# Patient Record
Sex: Male | Born: 1960
Health system: Southern US, Community
[De-identification: ages and names within clinical notes are randomized; demographics above are authoritative.]

## PROBLEM LIST (undated history)

## (undated) DIAGNOSIS — I10 Essential (primary) hypertension: Secondary | ICD-10-CM

## (undated) DIAGNOSIS — K760 Fatty (change of) liver, not elsewhere classified: Secondary | ICD-10-CM

## (undated) DIAGNOSIS — K439 Ventral hernia without obstruction or gangrene: Secondary | ICD-10-CM

## (undated) DIAGNOSIS — F419 Anxiety disorder, unspecified: Secondary | ICD-10-CM

## (undated) DIAGNOSIS — C61 Malignant neoplasm of prostate: Secondary | ICD-10-CM

## (undated) DIAGNOSIS — E559 Vitamin D deficiency, unspecified: Secondary | ICD-10-CM

## (undated) DIAGNOSIS — K429 Umbilical hernia without obstruction or gangrene: Secondary | ICD-10-CM

## (undated) DIAGNOSIS — K645 Perianal venous thrombosis: Secondary | ICD-10-CM

## (undated) DIAGNOSIS — I4891 Unspecified atrial fibrillation: Secondary | ICD-10-CM

## (undated) DIAGNOSIS — F32A Depression, unspecified: Secondary | ICD-10-CM

## (undated) DIAGNOSIS — R0602 Shortness of breath: Secondary | ICD-10-CM

## (undated) DIAGNOSIS — E669 Obesity, unspecified: Secondary | ICD-10-CM

## (undated) DIAGNOSIS — G4730 Sleep apnea, unspecified: Secondary | ICD-10-CM

## (undated) HISTORY — DX: Ventral hernia without obstruction or gangrene: K43.9

## (undated) HISTORY — PX: CARDIOVERSION: SHX1299

## (undated) HISTORY — DX: Umbilical hernia without obstruction or gangrene: K42.9

## (undated) HISTORY — DX: Perianal venous thrombosis: K64.5

## (undated) HISTORY — DX: Sleep apnea, unspecified: G47.30

## (undated) HISTORY — PX: PROSTATE BIOPSY: SHX241

## (undated) HISTORY — DX: Anxiety disorder, unspecified: F41.9

## (undated) HISTORY — DX: Depression, unspecified: F32.A

## (undated) HISTORY — DX: Essential (primary) hypertension: I10

## (undated) HISTORY — DX: Unspecified atrial fibrillation: I48.91

## (undated) HISTORY — DX: Vitamin D deficiency, unspecified: E55.9

## (undated) HISTORY — DX: Obesity, unspecified: E66.9

## (undated) HISTORY — DX: Fatty (change of) liver, not elsewhere classified: K76.0

## (undated) HISTORY — DX: Shortness of breath: R06.02

## (undated) NOTE — *Deleted (*Deleted)
PIRADS 4 anterior into pelvic floor    PIRADS 3     MR U/S Fusion Biopsy 03/14/20

---
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
## 1998-05-23 ENCOUNTER — Ambulatory Visit (HOSPITAL_COMMUNITY): Admission: RE | Admit: 1998-05-23 | Discharge: 1998-05-23 | Payer: Self-pay | Admitting: Gastroenterology

## 1998-05-23 ENCOUNTER — Encounter: Payer: Self-pay | Admitting: Gastroenterology

## 2002-01-28 ENCOUNTER — Ambulatory Visit (HOSPITAL_BASED_OUTPATIENT_CLINIC_OR_DEPARTMENT_OTHER): Admission: RE | Admit: 2002-01-28 | Discharge: 2002-01-28 | Payer: Self-pay | Admitting: Internal Medicine

## 2004-02-24 ENCOUNTER — Encounter: Admission: RE | Admit: 2004-02-24 | Discharge: 2004-02-24 | Payer: Self-pay | Admitting: Orthopedic Surgery

## 2004-06-10 ENCOUNTER — Inpatient Hospital Stay (HOSPITAL_COMMUNITY): Admission: AD | Admit: 2004-06-10 | Discharge: 2004-06-12 | Payer: Self-pay | Admitting: Interventional Cardiology

## 2005-05-05 ENCOUNTER — Ambulatory Visit (HOSPITAL_COMMUNITY): Admission: RE | Admit: 2005-05-05 | Discharge: 2005-05-05 | Payer: Self-pay | Admitting: Interventional Cardiology

## 2005-05-31 ENCOUNTER — Ambulatory Visit: Payer: Self-pay | Admitting: Internal Medicine

## 2005-06-17 ENCOUNTER — Inpatient Hospital Stay (HOSPITAL_BASED_OUTPATIENT_CLINIC_OR_DEPARTMENT_OTHER): Admission: RE | Admit: 2005-06-17 | Discharge: 2005-06-17 | Payer: Self-pay | Admitting: Interventional Cardiology

## 2005-11-23 ENCOUNTER — Ambulatory Visit: Payer: Self-pay | Admitting: Internal Medicine

## 2005-12-24 ENCOUNTER — Ambulatory Visit: Payer: Self-pay | Admitting: Internal Medicine

## 2005-12-29 ENCOUNTER — Ambulatory Visit: Payer: Self-pay | Admitting: Cardiology

## 2005-12-29 ENCOUNTER — Ambulatory Visit (HOSPITAL_COMMUNITY): Admission: RE | Admit: 2005-12-29 | Discharge: 2005-12-29 | Payer: Self-pay | Admitting: Internal Medicine

## 2005-12-30 ENCOUNTER — Ambulatory Visit: Payer: Self-pay

## 2006-01-26 ENCOUNTER — Ambulatory Visit: Payer: Self-pay | Admitting: Cardiovascular Disease

## 2006-01-26 ENCOUNTER — Ambulatory Visit (HOSPITAL_COMMUNITY): Admission: RE | Admit: 2006-01-26 | Discharge: 2006-01-26 | Payer: Self-pay | Admitting: Internal Medicine

## 2006-02-09 ENCOUNTER — Ambulatory Visit: Payer: Self-pay | Admitting: Internal Medicine

## 2006-02-25 ENCOUNTER — Ambulatory Visit (HOSPITAL_COMMUNITY): Admission: RE | Admit: 2006-02-25 | Discharge: 2006-02-25 | Payer: Self-pay | Admitting: Cardiovascular Disease

## 2006-03-02 ENCOUNTER — Encounter: Payer: Self-pay | Admitting: Cardiology

## 2006-03-02 ENCOUNTER — Ambulatory Visit: Payer: Self-pay | Admitting: Cardiology

## 2006-03-02 ENCOUNTER — Ambulatory Visit (HOSPITAL_COMMUNITY): Admission: RE | Admit: 2006-03-02 | Discharge: 2006-03-02 | Payer: Self-pay | Admitting: Cardiology

## 2006-04-28 ENCOUNTER — Ambulatory Visit: Payer: Self-pay | Admitting: Internal Medicine

## 2006-05-17 DIAGNOSIS — I4891 Unspecified atrial fibrillation: Secondary | ICD-10-CM

## 2006-05-17 HISTORY — PX: ABLATION SAPHENOUS VEIN W/ RFA: SUR11

## 2006-05-17 HISTORY — DX: Unspecified atrial fibrillation: I48.91

## 2006-05-30 ENCOUNTER — Inpatient Hospital Stay (HOSPITAL_COMMUNITY): Admission: EM | Admit: 2006-05-30 | Discharge: 2006-06-01 | Payer: Self-pay | Admitting: Emergency Medicine

## 2006-06-08 ENCOUNTER — Ambulatory Visit (HOSPITAL_COMMUNITY): Admission: RE | Admit: 2006-06-08 | Discharge: 2006-06-08 | Payer: Self-pay | Admitting: Cardiology

## 2006-06-14 ENCOUNTER — Ambulatory Visit: Payer: Self-pay | Admitting: Internal Medicine

## 2006-08-31 ENCOUNTER — Ambulatory Visit (HOSPITAL_COMMUNITY): Admission: RE | Admit: 2006-08-31 | Discharge: 2006-08-31 | Payer: Self-pay | Admitting: Internal Medicine

## 2008-02-26 ENCOUNTER — Ambulatory Visit: Payer: Self-pay | Admitting: Internal Medicine

## 2008-05-17 HISTORY — PX: APPENDECTOMY: SHX54

## 2008-05-17 HISTORY — PX: HERNIA REPAIR: SHX51

## 2008-06-11 ENCOUNTER — Ambulatory Visit (HOSPITAL_COMMUNITY): Admission: RE | Admit: 2008-06-11 | Discharge: 2008-06-12 | Payer: Self-pay | Admitting: Surgery

## 2008-06-11 ENCOUNTER — Encounter: Admission: RE | Admit: 2008-06-11 | Discharge: 2008-06-11 | Payer: Self-pay | Admitting: Internal Medicine

## 2008-06-11 ENCOUNTER — Encounter (INDEPENDENT_AMBULATORY_CARE_PROVIDER_SITE_OTHER): Payer: Self-pay | Admitting: Surgery

## 2008-10-01 ENCOUNTER — Telehealth: Payer: Self-pay | Admitting: Internal Medicine

## 2008-11-15 ENCOUNTER — Telehealth: Payer: Self-pay | Admitting: Internal Medicine

## 2010-05-17 DIAGNOSIS — K645 Perianal venous thrombosis: Secondary | ICD-10-CM

## 2010-05-17 HISTORY — DX: Perianal venous thrombosis: K64.5

## 2010-06-07 ENCOUNTER — Encounter: Payer: Self-pay | Admitting: Cardiology

## 2010-06-15 ENCOUNTER — Other Ambulatory Visit: Payer: Self-pay | Admitting: Dermatology

## 2010-08-31 LAB — CBC
HCT: 40.5 % (ref 39.0–52.0)
Hemoglobin: 14 g/dL (ref 13.0–17.0)
MCV: 90.6 fL (ref 78.0–100.0)
RBC: 4.47 MIL/uL (ref 4.22–5.81)
WBC: 14.4 10*3/uL — ABNORMAL HIGH (ref 4.0–10.5)

## 2010-08-31 LAB — BASIC METABOLIC PANEL
Chloride: 102 mEq/L (ref 96–112)
GFR calc Af Amer: >60 mL/min (ref >60)
Potassium: 3.7 mEq/L (ref 3.5–5.1)
Sodium: 137 mEq/L (ref 135–145)

## 2010-09-29 NOTE — Assessment & Plan Note (Signed)
[facility]                         ELECTROPHYSIOLOGY OFFICE NOTE   NAME:DUNNLendon, George                         MRN:          [id number]  DATE:02/26/2008                            DOB:          02-Apr-1961    Mr. Mooneyhan is seen at his request regarding his blood pressure.  He is  status post atrial fibrillation ablation done by Dr. Gala Romney at [facility].  He came in because he was concerned about his blood pressure.  On  evaluation, it was 140/80.  His lungs were clear.  Heart sounds were  regular.  His extremities were without edema.  I should note also his  weight was 267 up about 15 pounds in the last year and half.   We had a long discussion regarding weight loss, its impact on blood  pressure and its relation with sleep apnea.  My hope is that he will be  able to make a choice to pursue a weight target of 220 pounds, which  with his 42-inch waist is something that should be achievable.   We will plan to see him again 1 year's time.     Duke Salvia, MD, [facility]  Electronically Signed    SCK/MedQ  DD: 02/26/2008  DT: 02/27/2008  Job #: [id number]   cc:   Theressa Millard, M.D.

## 2010-09-29 NOTE — Op Note (Signed)
NAME:  Tony [name], Tony [name] NO.:  [id number]   MEDICAL RECORD NO.:  [id number]          PATIENT TYPE:  OIB   LOCATION:  1501                         FACILITY:  [facility]   PHYSICIAN:  Ardeth Sportsman, MD     DATE OF BIRTH:  August 05, 1960   DATE OF PROCEDURE:  DATE OF DISCHARGE:                               OPERATIVE REPORT   PRIMARY CARE PHYSICIAN:  Dr. Theressa Millard.   SURGEON:  Ardeth Sportsman, MD.   ASSISTANT:  None   PREOPERATIVE DIAGNOSIS:  Acute appendicitis.   POSTOPERATIVE DIAGNOSIS:  Acute appendicitis (nonperforated).  Small umbilical hernia.   PROCEDURE PERFORMED:  Diagnostic laparoscopy with appendectomy.  Primary umbilical hernia repair.   ANESTHESIA:  1. General anesthesia.  2. Local anesthetic and a field block at all port sites.   SPECIMEN:  Appendix.   DRAINS:  None.   ESTIMATED BLOOD LOSS:  10 mL   COMPLICATIONS:  None apparent.   INDICATIONS:  Tony [name] as a pleasant 50 year old gentleman who has had  about a 12-hour history of worsening abdominal pain to the right lower  quadrant when he saw Dr. Earl [name].  Patient underwent a CAT scan  significant for appendicitis.  Therefore, he was sent to [facility] for surgical evaluation.  History and physical and CT scan  findings were concerning for appendicitis.   Anatomy and physiology of the digestive tract was explained.  Pathophysiology of appendicitis with its natural history risks were  discussed.  Options discussed.  Recommendations made for diagnostic  laparoscopy with appendectomy.  Risks, benefits, alternatives discussed.  Questions answered, he and his wife agreed to proceed.   OPERATIVE FINDINGS:  He had early appendicitis with some omental  adhesions to the appendix, but no major suppuration perforation or  abscess.  He had no evidence of any inguinal hernias.  He had evidence  of some old mild adhesions along his sigmoid colon consistent with prior  diverticular attack.  He  had a small umbilical hernia about 8 mm in  size.   DESCRIPTION OF PROCEDURE:  Informed consent was confirmed.  Patient was  given IV cefoxitin just prior to surgery.  He had sequential compression  devices active during the entire case.  He underwent general anesthesia  without any difficulty.  He was positioned supine with both arms tucked.  He had a Foley catheter sterilely placed.  His abdomen and mons pubis  were clipped, prepped and  draped in sterile fashion.   A final port was placed through the umbilical hernia using a mini Hasson  technique.  Capnoperitoneum to 15 mmHg provided good intra-abdominal  insufflation.  Under direct visualization, final port was placed in the  left flank.  The umbilical port was upgraded to a 12 mm port.  A final  port was placed suprapubically away from the bladder.   Camera inspection noted findings above.  The small bowel and omentum  were swept of the right lower quadrants.  The patient placed left side  down.  There were some greater omental attachments to the appendix and  these were freed off.  The appendix tip was somewhat anterior so I was  able to follow it down.  I ended up mobilizing the cecum in a lateral  medial fashion using ultrasonic dissection.  The mesoappendix was  isolated as well as the fold of Treves along the terminal ileum was  carefully freed off as there were some adhesions to the appendix there  as well.  Eventually, I was able to get a window at the base of the  mesoappendix and transect that using ultrasonic dissection.  This showed  excellent visualization of all of the appendix at its base.  Appendix  was stapled off of its base using a laparoscopic stapler to good result.  It was placed in an EndoCatch bag and removed through the umbilical  wound with some minimal dilation.   Camera inspection revealed excellent hemostasis and the staple line was  healthy.  Copious irrigation of a liter was done with clear  return.  There is no evidence of contamination elsewhere.  Capnoperitoneum was  evacuated and ports removed.  The umbilical defect including the hernia  was reapproximated using 0 Vicryl figure-of-eight stitch to good result.  Skin was closed using 4-0 Monocryl stitch.  Sterile dressings applied.   The patient was extubated and sent to the recovery room in stable  addition.  I explained the operative findings to the patient's family  and postop instructions were discussed in detail with the patient's wife  and parents and family.  Questions answered.  They expressed  understanding and appreciation.      Ardeth Sportsman, MD  Electronically Signed     SCG/MEDQ  D:  06/11/2008  T:  06/12/2008  Job:  [id number]   cc:   Theressa Millard, M.D.  Fax: [phone]

## 2010-09-29 NOTE — H&P (Signed)
NAME:  Tony [name], Tony [name] NO.:  [id number]   MEDICAL RECORD NO.:  [id number]          PATIENT TYPE:  AMB   LOCATION:  DAY                          FACILITY:  [facility]   PHYSICIAN:  Ardeth Sportsman, MD     DATE OF BIRTH:  [birth year]   DATE OF ADMISSION:  06/11/2008  DATE OF DISCHARGE:                              HISTORY & PHYSICAL   PRIMARY CARE PHYSICIAN:  Theressa Millard, M.D.   CARDIOLOGIST:  Lyn Records, M.D.   SURGEON:  Ardeth Sportsman, M.D.   REASON FOR CONSULTATION/ADMISSION:  Acute appendicitis.   HISTORY OF PRESENT ILLNESS:  Tony [name] is  50 year old gentleman with  some chronic health issues, pretty well controlled, including sleep  apnea, atrial fibrillation, status post ablation, off all meds, obesity,  and hypertension.  He noted that he woke up last night with abdominal  pain that was rather diffuse.  It was intensified and became more focal  in the right lower quadrant.  He has felt nauseated but not thrown up.  He denies any sick contacts or travel history.  He has always otherwise  been well.  He has not had anything like this before.  He came and saw  Dr. Earl Gala.  The suspicion was high enough that he got a CAT scan which  was suspicious for appendicitis.  Therefore, the patient was sent for  requested surgical consultation.  He is being evaluated for  consideration of surgery.   Patient has had a history of diverticulitis in the past but no recent  pain such as this.  He has had a little bit of back pain but no pain on  the left side.  No hematochezia or melena.   PAST MEDICAL HISTORY:  1. Obstructive sleep apnea, stable on CPAP.  I believe the settings      are 8/5.  2. Atrial fibrillation.  It failed medical therapy and cardioversion,      but ultimately improved with ablation over at [facility], I think under the auspices of Dr. Elesa Hacker.  3. Iliopsoas hematoma from arterial puncture for the ablation.  Gradually resolved over time with some chronic right hip and groin      pain.  4. Steatohepatitis with transaminitis in the past.  5. Baker's cyst in the past.  6. Conjunctivitis in the past.   PAST SURGICAL HISTORY:  Open right inguinal hernia repair in the 60's.  Denies any recurrence.  He has had a vasectomy, but he has not had any  abdominal surgeries.   ALLERGIES:  He has no true drug allergies except for a bee sting.  He  did have sort of a wild, aggressive  reaction after getting his ablation  but does not recall what medications were given at that time.  There was  some question of problems with opioids, but he does not recall any  specifically.   MEDICATIONS:  None at this time.   SOCIAL HISTORY:  He is married and in a stable relationship.  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  Occasionally drinks  wine.  No tobacco or other drug use.   FAMILY HISTORY:  His father had prostate cancer.  Mother has had breast  cancer.  Had a myocardial infarction.  He has 2 siblings alive and well.   REVIEW OF SYSTEMS:  Noted as per HPI.  CONSTITUTIONAL:  No fever,  chills, or sweats.  He is trying to have some weight loss but no major  change in his weight recently.  He does wears glasses, otherwise stable.  ENT:  Stable.  RESPIRATORY:  CPAP is well controlled and improves  sleeping.  He denies any significant change in breathing or dyspnea on  exertion.  CARDIAC:  As noted per HPI.  No recent dysrhythmias noted.  ABDOMEN:  As noted per HPI.  Breasts, testicular, musculoskeletal,  neurological, heme/lymph, allergic are otherwise negative.  DERMATOLOGIC:  He has some benign skin lesions but no history of skin  cancers.  HEPATIC:  Some transaminitis but renal and endocrine are  otherwise negative as well.   PHYSICAL EXAMINATION:  VITAL SIGNS:  Noted in the chart and within  normal range.  He is a well-developed, well-nourished, slightly overweight male,  obviously  uncomfortable.  Not frankly toxic.  PSYCH:  He has above-average intelligence with good insight.  No  evidence of any dementia, delirium, psychosis, or paranoia.  HEENT:  Pupils are equal, round and reactive to light.  Extraocular  muscles are intact.  Sclerae are anicteric and injected.  He does wears  glasses.  He is normocephalic.  Mucous membranes are moist.  Nasopharynx  and oropharynx are clear.  NECK:  Supple without any masses.  Trachea is midline.  LYMPH:  No head, neck, axillary, or groin lymphadenopathy.  CHEST:  Clear to auscultation bilaterally.  No wheezes, rales or  rhonchi.  No pain to rib or sternal compression.  HEART:  Regular rate and rhythm.  ABDOMEN:  Obese but soft.  He is exquisitely tender in the right lower  quadrant over McBurney's point in the classic location with a positive  Rovsing's sign with pressing on the left side.  He has some mild diffuse  tenderness.  He may have some slight weakness at the belly button and  umbilicus, but no definite umbilical hernia.  GENITAL:  He has some fullness in both groins but no definite inguinal  hernia.  Some tenderness in his right greater than left groin, otherwise  he is circumcised.  Testes are descended bilaterally.  RECTAL:  Deferred.  SKIN:  No obvious petechiae.  No purpura.  No major source of lesions.  MUSCULOSKELETAL:  Full range of motion in the shoulders, wrists, hips,  knees, and ankles.  BACK:  Some mild lumbar back pain but no specific point tenderness.   His labs are pending.   EKG shows sinus rhythm.   CT scan shows the distal half of his appendix is somewhat thickened with  some perifat stranding, consistent with early appendicitis.  No evidence  of perforation or abscess.  No evidence of bowel obstruction.  I cannot  see any definite inguinal or umbilical hernia at this time.   ASSESSMENT:  Acute appendicitis:  The anatomy and physiology of  digestive tract explained.  The pathophysiology of  appendicitis was  explained.  Options were discussed and recommendations made for  diagnostic laparoscopy with removal of appendix.  Risks, benefits and  alternatives were discussed in detail.  Questions answered with he and  his wife, and  they agreed to proceed.   PLAN:  1. Admit for diagnostic laparoscopy and appendectomy.  2. IV antibiotics.  3. Follow up lab-work values.  4. CPAP postop.  He brought his machine with him.      Ardeth Sportsman, MD  Electronically Signed     SCG/MEDQ  D:  06/11/2008  T:  06/11/2008  Job:  [id number]   cc:   Theressa Millard, M.D.  Fax: [phone]   Lyn Records, M.D.  Fax: [phone]

## 2010-10-02 NOTE — Letter (Signed)
November 23, 2005    Dr. Shawnie Pons  [facility]  [address]  [place], [place]  [address]   RE:  DIALLO, PONDER  MRN #[id number]  /  DOB:  14-Jun-1960   Dear Dorene Grebe,   This letter is to update you on Tony [name] who will be coming to see you  later this month.  You should have a packet on him.  He is a 50 year old  gentleman with paroxysmal and now persistent atrial fibrillation who desires  ablation of his atrial fibrillation.  He has previously been on sotalol  which failed to hold him in sinus rhythm.  Because of a lot of business  issues the alternative of using Tikosyn in the short term was deferred and  now his appointment with you is forthcoming.  We had a lengthy discussion  about treatment options and his desire to get this done remains high.  He  has been anticoagulated.  I currently have discontinued his sotalol as he  has been in atrial fibrillation on it for the last number of months and have  given him prescriptions for alternative beta blockers to see if he can find  one that is not associated with the lethargy the sotalol is causing.   I look forward to hearing from you about him.  My best to you and your  family.   Lenon Oms, MD, [facility]   SCK/MedQ  DD:  11/23/2005  DT:  11/23/2005  Job #:  [id number]

## 2010-10-02 NOTE — Op Note (Signed)
NAME:  Tony [name], Tony [name] NO.:  [id number]   MEDICAL RECORD NO.:  [id number]          PATIENT TYPE:  INP   LOCATION:  2006                         FACILITY:  [facility]   PHYSICIAN:  Lyn Records III, M.D.DATE OF BIRTH:  April 12, 1961   DATE OF PROCEDURE:  06/12/2004  DATE OF DISCHARGE:                                 OPERATIVE REPORT   PROCEDURE:  Elective electrical cardioversion.   INDICATIONS FOR PROCEDURE:  Atrial fibrillation.   DESCRIPTION OF PROCEDURE:  The patient was given sedation with sodium  Pentothal 375 mg IV by Dr. Sampson Goon.  After the patient was asleep, using  an anterior and posterior electrode orientation, 200 joules of biphasic  energy was discharged with reversion to normal sinus rhythm/sinus  bradycardia.  The patient awakened with no complications.   CONCLUSION:  Successful conversion from atrial fibrillation to normal sinus  rhythm.   PLAN:  1.  Continue Betapace 120 mg p.o. b.i.d.  2.  Home later today if he awakens adequately and has no evidence of QT      prolongation and no other problems arise.      HWS/MEDQ  D:  06/12/2004  T:  06/12/2004  Job:  [id number]   cc:   Theressa Millard, M.D.  [address] [place]  [place] [address]  Fax: [phone]

## 2010-10-02 NOTE — Discharge Summary (Signed)
NAME:  Tony, [name] NO.:  [id number]   MEDICAL RECORD NO.:  [id number]          PATIENT TYPE:  OIB   LOCATION:  2899                         FACILITY:  [facility]   PHYSICIAN:  Maple Mirza, PA   DATE OF BIRTH:  03/09/1961   DATE OF ADMISSION:  02/25/2006  DATE OF DISCHARGE:  02/25/2006                                 DISCHARGE SUMMARY   ALLERGIES:  THIS PATIENT HAS NO KNOWN DRUG ALLERGIES.   PRINCIPLE DIAGNOSES:  1. Recurrence of atrial fibrillation with controlled ventricular rate.      a.     Multiple cardioversions in the past, the last one September 2007       on flecainide 100 mg b.i.d., currently on flecainide 150 mg       b.i.d.      b.     First diagnosis atrial fib 03/2004 at physical exam.      c.     The patient in the past has failed Sotalol.  Increased doses       cause fatigue and bradycardia.  Decreased doses cause       recurrence.      d.     Started Flecainide July 2007.  Tikosyn considered, but deferred       secondary to patient anticipating atrial fibrillation       ablation       at [facility] March 14, 2006.   1. DCCV cancelled February 25, 2006, secondary to sub-therapeutic INR which      was 1.7.  A stat redo was 1.8.  Oddly enough, records from [facility]      [facility]iology showed that his INR was 3.0, as recently as February 22, 2006.  2. Catheterization February 2007.  Normal coronaries, ejection fraction      greater than or equal to 50%.  3. Obstructive sleep apnea.  4. Modest obesity.   PROCEDURE:  The planned procedure, elective DC Cardioversion cancelled  secondary to sub-therapeutic INR.   BRIEF HISTORY:  Mr. Tony [name] is a 50 year old male with history of atrial  fibrillation, first diagnosed at physical examination November 2005.  He had  an echocardiogram at that time which showed ejection fraction 40%.  He was  started on sotalol.  His first cardioversion was January 2006, and his  Sotalol had been decreased 120 mg to 80 mg  secondary to fatigue.  The second  Cardioversion actually came about 12 months later December 2006.  He had an  echocardiogram in May 2006 which showed ejection fracture of 60%.  The  patient is not very clear as to what medications he was on after his Sotalol  was discontinued.  We do know that flecainide was started, after successful  catheterization showing normal coronaries in February 2007, at 100 mg b.i.d.  He had recurrence in August 2007 and had his third Cardioversion.  At that  time, his flecainide was increased to 150 mg b.i.d.  He recurred in  September 2007, and had his fourth Cardioversion at that time.   The patient noted that he gone back  into atrial fibrillation February 21, 2006.  I know when I am back.  He called the office of [facility]  on October 9th, was told to present October 12th at 2:00 for Cardioversion.  At that time, it was known that his INR was therapeutic throughout the year  of 2007.   HOSPITAL COURSE:  As noted above, the patient's Cardioversion was  discontinued.  He discharges with a follow-up CEE/DCCV on Wednesday October  17 at 2:00, Dr. Nona Dell.  He is to eat nothing after midnight  Tuesday October 16th.   MEDICATIONS ON DISCHARGE:  1. Coumadin 12.5 mg to take Friday, Saturday and Sunday and then to resume      his regular doses of 10 mg Monday, Wednesday, Friday, 12.5 mg Tuesday,      Thursday, Saturday, Sunday.  We will redo PT/INR, when he presents on      Wednesdays October 17th.           ______________________________  Maple Mirza, PA     GM/MEDQ  D:  02/25/2006  T:  02/27/2006  Job:  [id number]   cc:   Duke Salvia, MD, [facility]  Lyn Records, M.D.  Theressa Millard, M.D.

## 2010-10-02 NOTE — Letter (Signed)
June 14, 2006    Dr. Val Riles  [facility]  [facility] [id number]  [place], [place] [address]  [facility] #[id number]   RE:  Tony [name], Tony [name]  MRN:  [id number]  /  DOB:  [birth year]   Dear Rosaura Carpenter,   Mr. Eifler came in today following his PVI ablation. He has had no  recurrent arrhythmia. He is on his Flecainide at 150 twice daily.   As you know, he had an iliopsoas bleed that was managed with vitamin K.  I should note that he also had a moderate pericardial effusion by CT.  His INR at discharge was 1.2, Coumadin was resumed and he is on  anticoagulation at the present.   He had as related to his iliopsoas bleed significant left lower leg  weakness which has now resolved in large part.   PHYSICAL EXAMINATION:  His blood pressure was 110/70, his pulse was 55,  his leg strenth was pretty good.   IMPRESSION:  1. Paroxysmal atrial fibrillation status PVI.  2. Iliopsoas bleed and pericardial effusion probably related to the      above.  3. Leg weakness related to the above now much improved.  4. Flecainide therapy for #1.   I trust Mr. Heckmann is doing pretty well. He continues on his Flecainide. I  told him that when he saw you in a couple of weeks that you would make a  decision as to how long he should stay on Flecainide. My bias would be  to keep it on longer rather than shorter.    Sincerely,      Duke Salvia, MD, [facility]  Electronically Signed    SCK/MedQ  DD: 06/14/2006  DT: 06/14/2006  Job #: [id number]   CC:    Lyn Records, M.D.

## 2010-10-02 NOTE — Op Note (Signed)
NAME:  Tony [name], Tony [name] NO.:  [id number]   MEDICAL RECORD NO.:  [id number]          PATIENT TYPE:  OIB   LOCATION:  2899                         FACILITY:  [facility]   PHYSICIAN:  Veverly Fells. Excell Seltzer, MD  DATE OF BIRTH:  01/12/1961   DATE OF PROCEDURE:  DATE OF DISCHARGE:  02/25/2006                                 OPERATIVE REPORT   PRIMARY CARE DOCTOR:  Dr. Theressa Millard.   ELECTROPHYSIOLOGIST:  Dr. Sherryl Manges.   CARDIOLOGIST:  Dr. Lyn Records.   HE HAS NO KNOWN DRUG ALLERGIES.   PRESENTING CIRCUMSTANCE:  I am here for a cardioversion.   HISTORY OF PRESENT ILLNESS:  Tony [name] is a 50 year old male.  He has a  history of atrial fibrillation, first diagnosed on a routine physical  examination in November 2005.  He had an echocardiogram at that time, which  showed an ejection fraction of 40%.  He was started on sotalol with initial  success at maintaining sinus rhythm.  His first cardioversion for recurrence  was in January 2006.  His sotalol had been recently discontinued from 120 mg  b.i.d. to 80, secondary to fatigue and bradycardia.  His second  cardioversion was actually about 12 months later in December 2006, and his  sotalol was eventually discontinued secondary to continued fatigue and  bradycardia.  In May 2006, he had a followup echocardiogram which showed an  ejection fraction of 60%.  The patient is not clear as to what medications  he was actually maintained on after discontinuing sotalol, however, we do  pick up the history in July 2007, after a successful catheterization  February 2007, which showed normal coronaries, an ejection fraction greater  than 50%.  He underwent the initiation of flecainide 100 mg twice a day.  He  underwent his third cardioversion in August 2007, under flecainide.  He did  well until about a month later, when he recurred back into atrial  fibrillation.  His flecainide was increased to 150 mg b.i.d. and he had his  fourth cardioversion in September.   On February 21, 2006, the patient noted that he had relapsed back into atrial  fibrillation.  I know when I am back.  He called the office of [facility] on February 22, 2006.  He was told to present on February 25, 2006,  at 2 p.m. for cardioversion.  Of note, the patient's atrial fibrillation had  been actually very rapid especially with exercise in the past but lately it  is a controlled ventricular rate.  We have his INR records from [facility], which showed that the INR is above 2 throughout the year of  2007.  In fact on February 22, 2006, his INR was 3.  However, on presentation  today for cardioversion his INR was 1.7.  A STAT redo showed an INR of 1.8.  His cardioversion appointment was cancelled for today, February 25, 2006.   The patient's medications include:  1. Coumadin 10 mg Monday, Wednesday, Friday; 12.5 mg Saturday, Sunday,      Tuesday,  Thursday.  2. Atenolol 50 mg daily.  3. Flecainide 150 mg twice daily.   SOCIAL HISTORY:  The patient lives in [place] with his wife of 24 years.  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  He does not smoke, occasional alcoholic beverages, no  recreational drugs.   FAMILY HISTORY:  His mother is living.  She has been diagnosed with a long  QT syndrome.  Father is living.  He has no cardiac history.  He has two  sisters with atrial fibrillation.   REVIEW OF SYSTEMS:  He denied having fever, chills, night sweats, or weight  change in last 6 months.  No recurrent headaches, no nasal discharge or  epistaxis.  No voice change, no vertigo, no photophobia.  INTEGUMENT:  No  rashes or non-healing ulcerations to the lower extremities.  CARDIOPULMONARY:  No chest pain.  He does get short of breath with dyspnea  on exertion with return of atrial fibrillation.  He has sometimes had  paroxysmal nocturnal dyspnea but this is more likely from his  obstructive  sleep apnea.  He has a history of presyncope at times and would swoon but  never frank syncope and since his atrial fibrillation is slow, he does not  have a feeling of palpitation.  UROGENITAL:  No urinary problems, no  frequency or urgency, dysuria, or nocturia.  NEUROPSYCHIATRIC:  No weakness,  numbness, depression, or anxiety.  GASTROINTESTINAL:  No history of frequent  heartburn or reflux.  No history of GI bleeding.  ENDOCRINE:  No history of  thyroid or diabetes.  No polyuria, no polydipsia.  No skin or hair changes.  MUSCULOSKELETAL:  No arthralgias, no joint effusions.  All other systems  negative.   PAST MEDICAL HISTORY:  1. Borderline hypertension.  2. Obstructive sleep apnea.  3. Modest obesity.   He denies prior history of thyroid problems, diabetes, cerebrovascular  accident, myocardial infarction, DVT, or pulmonary embolus.   He had a catheterization in February 2007, which was done for a Myoview  suggesting inferoapical ischemia.  He was found to have normal coronaries,  ejection fraction greater than or equal to 50%.  This was a false positive  Myoview.  He had an echocardiogram in May 2006, which showed ejection  fraction 60%   PHYSICAL EXAMINATION:  VITAL SIGNS:  Temperature 97.1, pulse is 75  irregular, blood pressure 133/85, respirations of 20, oxygen saturation 98%  on room air.  Weight is 245.  GENERAL:  He is alert and oriented x3.  HEENT:  Normocephalic, atraumatic.  Eyes pupils equal, round, reactive to  light.  Extraocular movements are intact.  Sclerae are anicteric.  Nares  without discharge.  NECK:  Supple without carotid bruits.  No thyromegaly.  No jugular venous  distension.  No cervical lymphadenopathy.  HEART:  Has slow and irregular rhythm without murmurs, rubs, or gallops.  LUNGS:  Clear to auscultation/percussion bilaterally.  ABDOMEN:  Soft, nondistended.  No rebound or guarding.  No hepatosplenomegaly.  Abdominal aorta is  non-pulsatile, non-expansile.  EXTREMITIES:  Show no evidence of clubbing, cyanosis, edema.  MUSCULOSKELETAL:  No joint deformity, effusions, kyphosis, or scoliosis.  NEUROLOGIC:  Neurologic: Alert, oriented x3.  Cranial nerves grossly intact.   LABORATORY STUDIES:  This admission, the PT was 20.7, INR 1.7.  Followup INR  was 1.8.  Complete blood count:  White cells 5.2, hemoglobin 14.9,  hematocrit 43.1, platelets 187.  Serum electrolytes:  Sodium 138,  potassium  3.8, chloride 103, carbonate 27, BUN 16, creatinine 1.2, glucose was 87.   IMPRESSION:  1. Recurrence of atrial fibrillation with controlled ventricular rates.      a.     Multiple D/C cardioversion procedures in the past.  The last one       was in September 2007, on flecainide 100 mg twice a day, which was       subsequently increased to 150 mg twice a day.      b.     First diagnosed with atrial fibrillation ion November 2005, on       physical exam.      c.     Failed sotalol, increasing doses caused fatigue and bradycardia,       decreasing doses cause recurrence of atrial fibrillation.      d.     Started flecainide July 2007.  Tikosyn was considered but       deferred as the patient is a candidate for atrial fibrillation       ablation, October 29 at the [facility].  2. Catheterization, February 2007, normal coronaries, ejection fraction      greater than or equal to 50%.  3. Sub-therapeutic INR on presentation February 25, 2006.  INR was 3.0 and      blood draw at [facility] on October 9.  4. Obstructive sleep apnea.  5. Modest obesity.   PLAN:  1. Cardioversion.  Plan for today is cancelled.  2. The patient will have followup transesophageal echocardiogram D/C      cardioversion on Wednesday, March 02, 2006 at 2 p.m. with Dr. Nona Dell.  We would like to get a PT/INR at that time.  3. He will discharge on his presentation medications, except for Coumadin      12.5 mg to be taken today and  over the weekend Saturday and Sunday and      to recommence his normal schedule which is 12.5 mg Tuesday, Thursday,      Saturday, Sunday and 10 mg Monday, Wednesday, Friday.  4. Once again he presents to [facility] short stay and then to      endoscopy Wednesday, October 17 at 2.      a.     He is asked to eat nothing after midnight Tuesday October 16,       20 07.      Maple Mirza, [place]      Veverly Fells. Excell Seltzer, MD  Electronically Signed    GM/MEDQ  D:  02/25/2006  T:  02/27/2006  Job:  [id number]

## 2010-10-02 NOTE — Cardiovascular Report (Signed)
NAME:  Tony [name], Tony [name] NO.:  [id number]   MEDICAL RECORD NO.:  [id number]          PATIENT TYPE:  OIB   LOCATION:  1963                         FACILITY:  [facility]   PHYSICIAN:  Lyn Records, M.D.   DATE OF BIRTH:  [birth year]   DATE OF PROCEDURE:  06/17/2005  DATE OF DISCHARGE:                              CARDIAC CATHETERIZATION   INDICATIONS:  The patient has been referred to the [facility] electrophysiology  group for help with management of atrial fibrillation and consideration of  catheter ablation and/or alternative treatment options.  The patient has  undergone successful electrical cardioversion on two prior occasions and has  been relatively stable on Betapace; however, moderate doses of Betapace  cause bradycardia and unacceptable side effects.  On lower doses of  Betapace, atrial fibrillation recurs.  His most recent cardioversion was  done in late December.  Upon referral to Dr. Graciela Husbands, consideration of  dofetilide therapy has occurred versus [facility]  Dr. Graciela Husbands requested an ischemic evaluation.  Cardiolite  study suggested the possibility of inferoapical ischemia.  It was felt that  this scan appearance was low risk but considering the need for no ischemia  to be able to safely use dofetilide, coronary angiography is being  performed.   PROCEDURE PERFORMED:  1.  Left heart catheterization.  2.  Selective coronary angiography.  3.  Left ventriculography.   DESCRIPTION:  After informed consent, a 4 French sheath was placed in the  right femoral artery using the modified Seldinger technique.  A 4-French A2  multipurpose catheter was used for hemodynamic recordings, left  ventriculography by hand injection, and selective right coronary  angiography.  Left coronary angiography was performed with a 4-French #4  left Judkins catheter.  The patient tolerated the procedure without  complications.   RESULTS:   HEMODYNAMIC DATA:  1.  Aortic pressure 106/82.  2.  Left ventricular pressure 106/.   LEFT VENTRICULOGRAPHY:  The left ventricle is mildly to moderately dilated.  There appears to be some suggestion of mild global hypokinesis with an  ejection fraction of at least 50%.  No mitral regurgitation is noted.   CORONARY ANGIOGRAPHY:  1.  Left main coronary:  The left main coronary artery is large, relatively      short, and gives origin to a large LAD, a dominant circumflex and ramus      intermedius branch.  No obstruction was noted.  2.  Left anterior descending coronary:  LAD reaches the left ventricular      apex.  It gives origin to a large proximal diagonal.  Minimal luminal      irregularity is noted in the LAD beyond the first septal perforator.  No      significant LAD obstruction noted, and the LAD is generally felt to be      normal.  3.  Circumflex artery:  The circumflex is a dominant vessel that gives rise      to three obtuse marginal branches and a PDA.  The obtuse marginal branch  is relatively small.  The circumflex proper is entirely normal.  The      first obtuse marginal contains minimal luminal irregularities.  A large      third obtuse marginal is free of any obstruction,  and one additional      obtuse marginal and the PDA are widely patent.  4.  Ramus intermedius:  The ramus intermedius arises from the distal left      main.  It is moderate in size and is free of obstruction.  5.  Right coronary:  The right coronary is nondominant and is normal.   CONCLUSION:  1.  Normal coronary arteries without evidence of any significant      atherosclerotic plaque or obstructive disease.  The patient's coronary      circulation is left-dominant.  2.  Left ventricular enlargement with mild global hypokinesis but overall      preserved ejection fraction, greater than or equal to 50%.  3.  False positive Cardiolite.   PLAN:  Further evaluation and therapy of atrial  fibrillation per Dr. Graciela Husbands.      Lyn Records, M.D.  Electronically Signed     HWS/MEDQ  D:  06/17/2005  T:  06/17/2005  Job:  [id number]   cc:   [facility] Salvia, M.D.  [address]  [address]  [place]  [place] [address]   Theressa Millard, M.D.  Fax: [phone]

## 2010-10-02 NOTE — Discharge Summary (Signed)
NAME:  Tony [name], Tony [name] NO.:  [id number]   MEDICAL RECORD NO.:  [id number]          PATIENT TYPE:  INP   LOCATION:  4728                         FACILITY:  [facility]   PHYSICIAN:  Lyn Records, M.D.   DATE OF BIRTH:  12/23/1960   DATE OF ADMISSION:  05/30/2006  DATE OF DISCHARGE:  06/01/2006                               DISCHARGE SUMMARY   PRIMARY CARE PHYSICIAN:  Theressa Millard.   ADMISSION DIAGNOSIS:  Left leg pain.   DISCHARGE DIAGNOSES:  1. Left leg pain secondary to iliopsoas hematoma called by a      spontaneous bleed.  2. History of paroxysmal atrial fibrillation status post catheter      ablation one week ago at [facility], now maintaining sinus      rhythm.  3. Mild to moderate pericardial effusion via CT.  4. Elevated liver enzymes, questionable etiology.  5. Anemia with decreased hemoglobin and hematocrit, stable.  6. Thrombocytopenia, questionable etiology.  7. Systemic anticoagulation with a subtherapeutic INR of 1.2.   PROCEDURES:  None during this admission.   HOSPITAL COURSE:  Tony [name] is a 50 year old Caucasian male with a known  history of paroxysmal atrial fibrillation.  He is status post a catheter  ablation at [facility] one week ago.  He was admitted to the [facility] on May 30, 2006 complaining of left leg pain and  muscle soreness.  A CT of abdomen and pelvis revealed an iliopsoas  hematoma.  Also, a mild to moderate pericardial effusion was identified  via CT.  The patient was on home Lovenox and was then transitioned to  Coumadin.  Lovenox and Coumadin were both held in the hospital.  The  patient's liver enzymes were elevated with a questionable etiology with  an AST upon admission of 138 and 62 upon discharge, an ALT of 297 upon  admission and 196 upon discharge.  He was restarted on Coumadin during  this admission and had an INR of 1.2.  A 2-D echocardiogram is pending  secondary to the pericardial effusion  diagnosis.  The patient's  hemoglobin dropped from 13.3 to 11.2; however, he was stable.  On  telemetry, he remained in normal sinus rhythm on flecainide 150 mg twice  daily and atenolol 25 mg daily.  Upon admission, he was given vitamin K.  On June 01, 2006, he was discharged to home in stable condition with  improvement in the leg pain.  He was discharged to home on Coumadin 10  mg daily with scheduled PT and INR check at the [facility] Coumadin clinic on  Friday of this week.  She is also being discharged to home on Vicodin  5/500 mg as needed for pain without refills.   LABORATORY DATA:  White blood count 5.0, hemoglobin 11.2, hematocrit  32.9, platelets 147,000.  PT 15.5, INR 1.2, PTT 39.  Sodium 140,  potassium 4.2, chloride 108, CO2 27, glucose 97, BUN 12, creatinine  1.06.  AST 138, 98, and 62, respectively; ALT 297, 256, and 196,  respectively.  Total protein upon discharge is  5.9, serum albumin upon  discharge is 3.2.   X-RAYS:  CT of abdomen without contrast on May 30, 2006 revealed a  mild to moderate anterior pericardial effusion.  CT of the pelvis  without contrast on May 30, 2006 revealed a moderate sized left  iliopsoas hematoma, especially involving the iliacus muscle.   EKG:  EKG May 31, 2006 revealed sinus bradycardia with a ventricular  rate of 54 beats per minute.  There was no evidence of ischemia.  QTC  was 426 milliseconds.   CONDITION ON DISCHARGE:  Tony [name] is being discharged to home today in  stable condition with only mild complaints of leg pain.  He had no  further complaints of angina, shortness of breath or groin pain.  His  groin is ecchymotic bilaterally.   DISCHARGE MEDICATIONS:  1. Coumadin 10 mg daily.  This represents an increased dosage; a      prescription was given with refills.  2. Flecainide 150 mg twice daily.  3. Atenolol 25 mg daily.  4. Vicodin 5/500 one to two tablets every 4 to 6 hours as needed for      pain; a  prescription was given without refills.   DISCHARGE INSTRUCTIONS:  1. No lifting greater than 10 pounds for 1 week.  2. Continue a low-sodium, heart-healthy diet.  3. Return to work on June 06, 2006.   FOLLOW-UP APPOINTMENTS:  1. A follow-up appointment has been scheduled with Dr. Theressa Millard      on June 02, 2006 at 4:15 p.m.  The patient is instructed to call      [phone] if he is unable to make the scheduled appointment.  2. A PT and INR check has been scheduled at the [facility] Coumadin clinic      on Friday, June 03, 2006 at 11:45 a.m.  3. A CBC with differential has been scheduled at the [facility] main lab in      the [facility] third floor on June 03, 2006 at      11:00 a.m.      Tylene Fantasia, [place]      Lyn Records, M.D.  Electronically Signed    RDM/MEDQ  D:  06/01/2006  T:  06/01/2006  Job:  [id number]   cc:   Theressa Millard, M.D.  Lyn Records, M.D.

## 2010-10-02 NOTE — Op Note (Signed)
NAME:  Tony [name], Tony [name] NO.:  [id number]   MEDICAL RECORD NO.:  [id number]          PATIENT TYPE:  OIB   LOCATION:  2899                         FACILITY:  [facility]   PHYSICIAN:  Willa Rough, M.D.     DATE OF BIRTH:  1961/01/05   DATE OF PROCEDURE:  DATE OF DISCHARGE:                                 OPERATIVE REPORT   The patient has atrial fib and he is cardioverted at this time.   Anesthesia is present.  The patient received 350 mg of IV Pentothal.  Anterior-posterior pads were used.  The patient received 100 joules of  biphasic energy and converted to normal sinus rhythm.  He is stable.           ______________________________  Willa Rough, M.D.     JK/MEDQ  D:  12/29/2005  T:  12/29/2005  Job:  [id number]   cc:   [facility] Salvia, M.D.

## 2010-10-02 NOTE — H&P (Signed)
NAME:  DRESDEN, LOZITO NO.:  [id number]   MEDICAL RECORD NO.:  [id number]          PATIENT TYPE:  OIB   LOCATION:  2899                         FACILITY[facility]   PHYSICIAN:  D[facility] Salvia, MD, FACCDATE OF BIRTH:  03-25-1961   DATE OF ADMISSION:  12/29/2005  DATE OF DISCHARGE:  12/29/2005                                HISTORY & PHYSICAL   Mr. Wiederholt is a patient with paroxysmal atrial fibrillation who is pending  catheter ablation at the [facility].  Currently scheduled for January.  He has been on flecainide therapy since July.  He reverted to atrial  fibrillation over the weekend.  He is to recardioverted tomorrow.   BLOOD WORK:  His last INR was therapeutic, but that will need to be checked  in the morning as well as other STAT labs.           ______________________________  Duke Salvia, MD, [facility]     SCK/MEDQ  D:  01/25/2006  T:  01/25/2006  Job:  [id number]

## 2010-10-02 NOTE — Assessment & Plan Note (Signed)
[facility]                                   ON-CALL NOTE   NAME:DUNNJlon, Betker                           MRN:          [id number]  DATE:03/02/2006                            DOB:          11-09-1960    Mr. Whitelaw called in this evening following a TEE and cardioversion done  earlier today, and had questions about his flecainide dose, which he thought  was either going to be adjusted or the medication was going to be changed.  He asked if he could find that out before the night was through.  I  contacted Dr. Graciela Husbands, who said to leave the flecainide dose as is.  I called  Mr. Hoogendoorn back and let him know to leave the flecainide dose as is.  He is  scheduled to follow up with Dr. Elyn Peers at [facility].  Mr. Alderman  apparently has requested medical records from our office to be faxed to [facility]  prior to his visit.  He did not feel confident that this was going to take  place, and I told him that I would call Amber, Dr. Odessa Fleming nurse, and leave  a message for her, and advised that he should do the same if the records do  not get sent for any reason.  He verbalized understanding and was very  grateful.      ______________________________  Nicolasa Ducking, ANP    ______________________________  [facility] Salvia, MD, [facility]     CB/MedQ  DD:  03/02/2006  DT:  03/04/2006  Job #:  [id number]

## 2010-10-02 NOTE — Letter (Signed)
April 28, 2006    Val Riles, MD  Department of Cardiology  [facility]  [facility] [id number]  [place], [place] [address]   RE:  BARRETT, GOLDIE  MRN:  [id number]  /  DOB:  [birth year]   Dear Rosaura Carpenter :   Mr. Ewbank came in today.  He is scheduled to have his atrial fibrillation  ablation procedure next month.  We discussed the preprocedural routine.  The one question I had is whether you were planning on stopping his  flecainide.  He did not have that note but he said he would call your  office to get that clarified.   We will plan to see him again sometime in late January.   I should note that today on examination his blood pressure was well-  controlled at 124/68, his pulse was 64.  Lungs were clear.  Heart sounds  were regular.   He recently was diagnosed with otitis media and was put on a Z-Pak.  His  INR yesterday was 2.2 and it will be checked again next a week.   Altamese Cabal Christmas to you and your family.    Sincerely,      [facility] Salvia, MD, [facility]  Electronically Signed   SCK/MedQ  DD: 04/28/2006  DT: 04/28/2006  Job #: [id number]

## 2010-10-02 NOTE — Cardiovascular Report (Signed)
NAME:  Tony [name], KREITER NO.:  [id number]   MEDICAL RECORD NO.:  [id number]          PATIENT TYPE:  OIB   LOCATION:  2852                         FACILITY[facility]  [facility]   PHYSICIAN:  Noralyn Pick. Eden Emms, MD, FACCDATE OF BIRTH:  Jul 05, 1960   DATE OF PROCEDURE:  01/26/2006  DATE OF DISCHARGE:  01/26/2006                            CARDIAC CATHETERIZATION   PROCEDURES:  Cardioversion.   Mr. Gabrielle is a 50 year old patient of Dr. Graciela Husbands and Dr. Garnette Scheuermann.  He  has a history of paroxysmal atrial fibrillation/flutter.  Apparently he  is due to have an ablation at [facility].  DC cardioversion was  performed due to the delay in the procedure and patient being in  symptomatic atrial fibrillation.   The patient was anesthetized with 250 mg sodium pentothal.  INR was  therapeutic at the time of the procedure.  He received a single 150  joule biphasic shock and converted to normal sinus rhythm.   IMPRESSION:  Successful direct current cardioversion.  The patient will  follow up with Dr. Garnette Scheuermann and Dr. Berton Mount in regards to  antiarrhythmic therapy prior to an ablation at [facility].      Noralyn Pick. Eden Emms, MD, [facility]  Electronically Signed     PCN/MEDQ  D:  07/18/2006  T:  07/18/2006  Job:  [id number]

## 2010-10-02 NOTE — Discharge Summary (Signed)
NAME:  Tony [name], Tony [name] NO.:  [id number]   MEDICAL RECORD NO.:  [id number]          PATIENT TYPE:  INP   LOCATION:  2006                         FACILITY:  [facility]   PHYSICIAN:  Lyn Records, M.D.   DATE OF BIRTH:  [birth year]   DATE OF ADMISSION:  06/10/2004  DATE OF DISCHARGE:  06/12/2004                                 DISCHARGE SUMMARY   CHIEF COMPLAINT/REASON FOR ADMISSION:  [name] is a 50 year old patient  with new diagnosis of atrial fibrillation in December of 2005, previously  treated on an outpatient basis with Toprol and Coumadin anticoagulation, who  was still experiencing exertional tachycardia and shortness of breath and  therefore, was admitted to the hospital for anti-arrhythmic therapy loading  since INR has been therapeutic since June 04, 2004. The patient was  admitted with the following diagnosis:  1.  Atrial fibrillation. Admission for Betapace load.  2.  Chronic Coumadin anticoagulation.  3.  Mildly decreased left ventricular systolic function with ejection      fraction of 40%.   HOSPITAL COURSE:  PROBLEM 1:  Atrial fibrillation. Beta pace load. The  patient was admitted to the medical telemetry unit where routine pre-  antiarrhythmic labs and diagnostics were checked as follows:  CBC, C-met,  PT, PTT, magnesium, EKG. The results of all of these studies were within  normal limits. His initial EKG had a QTC of 0.449. The patient was started  on Betapace 80 mg daily. Initial potassium was within normal limits and  initial INR was 2.4. The patient remained in atrial fibrillation the  following day. Betapace was increased to 120 mg b.i.d. by Dr. Katrinka Blazing. The  patient was set up for direct cardioversion for June 12, 2004. He still  remained in atrial fibrillation on that day. INR was 2.6 and on June 12, 2004 the patient was successfully cardioverted to sinus rhythm with 200  joules without any complications other than some mild burn  areas to the  anterior chest wall. The patient was deemed appropriate, later that  afternoon for discharge home. He remained in sinus rhythm, sinus  bradycardia. Ventricular rate 55 to 60. QTC 441 milliseconds.   FINAL DISCHARGE DIAGNOSES:  1.  Atrial fibrillation status post Betapace load.  2.  Status post successful cardioversion. At this time, he has bradycardia.  3.  Mild left ventricular dysfunction with ejection fraction of 40%.  4.  Hypertension, currently controlled.   DISCHARGE MEDICATIONS:  1.  Stop Toprol.  2.  Coumadin 12.5 mg  Sunday, Tuesday, Thursday, and Friday. Otherwise,      Coumadin 10 mg other days of the week..  3.  Betapace AF 120 mg bid.   ACTIVITY:  No driving until Sunday morning.   DIET:  Heart healthy.   SPECIAL INSTRUCTIONS:  Coumadin precautions.   WOUND CARE:  Hydrocortisone cream 2% to the affected areas of the chest, 2  or 3 times per day for the next 5 days.   FOLLOW UP:  He is to see Dr. Katrinka Blazing on Friday, June 26, 2004 at 11:00  a.m. He is to have an EKG and a Coumadin Clinic appointment on Tuesday,  June 16, 2004 at 4:30 p.m.      ALE/MEDQ  D:  08/10/2004  T:  08/10/2004  Job:  [id number]   cc:   Theressa Millard, M.D.  [address] [place]  [place] [address]  Fax: [phone]

## 2010-10-02 NOTE — Procedures (Signed)
[facility]                                EXERCISE TREADMILL   NAME:Sheley, ROSHAWN AYALA                         MRN:          [id number]  DATE:02/09/2006                            DOB:          1960-12-20    Mr. Tessier was submitted for treadmill testing to assess for proarrhythmic  potential in the context of his recently increased dose of flecainide.  He  exercised to what appeared to be capacity on a modification of a standard  Bruce protocol with one-minute stages through 1, 2, and 3 for a total of  five minutes with two minutes in stage 4.  He had a normal blood pressure  response and a heart rate that peaked at about 86% of his predicted maximum  heart rate.  He had no wide complex rhythms.  He did have mild broadening of  his QRS.  He tolerated the procedure well.            ______________________________  Duke Salvia, MD, [facility]      SCK/MedQ  DD:  02/09/2006  DT:  02/11/2006  Job #:  [id number]

## 2010-10-02 NOTE — Cardiovascular Report (Signed)
NAME:  CLEE, PANDIT NO.:  [id number]   MEDICAL RECORD NO.:  [id number]          PATIENT TYPE:  OIB   LOCATION:  2852                         FACILITY:  [facility]   PHYSICIAN:  Noralyn Pick. Eden Emms, MD, FACCDATE OF BIRTH:  [birth year]   DATE OF PROCEDURE:  01/26/2006  DATE OF DISCHARGE:  01/26/2006                              CARDIAC CATHETERIZATION   PROCEDURE:  Cardioversion.   Mr. Swider is a delightful 50 year old patient of Dr. Graciela Husbands who is status post  multiple previous cardioversions. He was placed on flecainide 100 mg b.i.d.  His INR at that time was 2.5.   The patient was anesthetized with  225 mg sodium Pentothal. A single 150  joules biphasic shock was delivered. Patient converted to normal sinus  rhythm rate of 58.   PLAN:  Successful cardioversion the patient to have flecainide level drawn  possibly to increase dose to 150 b.i.d.  At some point he has plans to go to  the [facility] for atrial fibrillation ablation.           ______________________________  Noralyn Pick Eden Emms, MD, [facility]     PCN/MEDQ  D:  01/26/2006  T:  01/27/2006  Job:  [id number]

## 2010-10-02 NOTE — H&P (Signed)
NAME:  Tony [name], Tony [name] NO.:  [id number]   MEDICAL RECORD NO.:  [id number]          PATIENT TYPE:  OIB   LOCATION:  2856                         FACILITY:  [facility]   PHYSICIAN:  Lyn Records, M.D.   DATE OF BIRTH:  [birth year]   DATE OF ADMISSION:  05/05/2005  DATE OF DISCHARGE:                                HISTORY & PHYSICAL   PRIMARY CARE PHYSICIAN:  Theressa Millard, M.D.   CHIEF COMPLAINT:  Recurrent atrial fibrillation.   HISTORY OF PRESENT ILLNESS:  Mr. Tony [name] is a 50 year old male patient who was  diagnosed with atrial fibrillation in December of 2005.  The initial focus  of therapy at that time was on anticoagulation and ventricular rate control.  He was admitted in January of 2006 for a Betapace load with subsequent  elective cardioversion.  He converted to sinus bradycardia and maintained  sinus bradycardia with sinus rhythm without any symptoms until about 10 days  ago.  The patient states that he just did not feel right.  A subsequent EKG  revealed that the patient was back in atrial fibrillation with a controlled  ventricular response.  Plans are to admit today for outpatient direct  cardioversion.   REVIEW OF SYSTEMS:  Noncontributory.  The patient has not had any  constitutional symptoms such as fevers, chills, cough, cold, myalgias or  arthralgias.  No shortness of breath or dyspnea on exertion.  No chest pain,  no dizziness, no lightheadedness, no syncope, no near syncope, no orthopnea,  no abdominal pain, no reflux, or dark or bloody stools, no hematuria, no  dysuria, no lower extremity swelling.   FAMILY MEDICAL HISTORY:  Father with prostate cancer.  Mother with some sort  of arrhythmia.  Sister with arrhythmia.  Daughter with congenital heart  defects.   SOCIAL HISTORY:  The patient is married.  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  He has 4 children, ages 85, 75, 75 and 51.  He has  never used tobacco.  He uses occasional  alcohol socially.  He does not use  caffeine.  Occasional chocolate.   PAST MEDICAL HISTORY:  1.  New atrial fibrillation as of December 2005.  2.  Status post successful direct current cardioversion in January of 2006      with prior maintenance of sinus bradycardia.  3.  Chronic sotalol therapy.  4.  Chronic systemic anticoagulation.  5.  Borderline hypertension.  6.  Baker's cyst in September 2005.   PAST SURGICAL HISTORY:  Inguinal hernia repair in 1967.   ALLERGIES:  No known drug allergies.   CURRENT MEDICATIONS:  1.  Sotalol 120 mg b.i.d.  2.  Coumadin 12.5 mg on Mondays, Wednesdays, and Fridays; 10 mg the other      days of the week.   PHYSICAL EXAMINATION:  GENERAL:  This is a pleasant male patient without any  complaints of acute distress.  VITAL SIGNS:  Temperature 97.4, blood pressure 126/58, pulse 79 and regular,  respirations 20.  Height is 6 feet, 4 inches.  Weight is 248 pounds.  NEUROLOGIC:  The patient is alert and oriented x3, moving all extremities  x4.  No focal neurological deficits noted.  Cranial nerves and DTRs were not  checked.  HEENT:  Head is normocephalic.  Sclerae not injected.  NECK:  Supple without adenopathy.  Thyroid not palpable.  CHEST:  Bilateral lung sounds were clear to auscultation.  Respiratory  effort was nonlabored.  He is on room air, satting 98%.  CARDIAC:  Heart sounds are S1 and S2 without rales, murmurs, thrills or  gallops.  Pulses are regular.  There is no JVD.  Carotids are 2+ bilaterally  without bruits.  ABDOMEN:  Soft, nontender, nondistended, without hepatosplenomegaly, masses  or bruits.  Bowel sounds present in all 4 quadrants.  EXTREMITIES:  No clubbing, cyanosis, or edema.  Pulses are palpable in the  extremities at 1 to 2+ bilaterally.   LABORATORY DATA:  Sodium 142, potassium 4.1, BUN 20, creatinine 1.4, glucose  118 nonfasting.  WBCs are 5400, hemoglobin 15, platelets 222,000.  PT 19.7,  INR 2.47, PTT 45.    DIAGNOSTICS:  EKG done in the office on April 21, 2005 demonstrated atrial  fibrillation with a ventricular rate of 71, QTC of 412 ms, no acute ischemic  changes.  EKG done here today on May 05, 2005 shows atrial  fibrillation, ventricular rate of 64, QTC 414 ms.   IMPRESSION:  1.  Recurrent atrial fibrillation with a controlled ventricular response.  2.  Continue sotalol therapy with normal QTC intervals.  3.  Systemic anticoagulation.  4.  Borderline hypertension.  5.  Prior successful direct current cardioversion in January of 2006.   PLAN:  The patient will be admitted to short stay C where he will undergo  direct current cardioversion today by Dr. Verdis Prime.  Plan on discharging  him post procedure if no complications.  We will check a portable chest x-  ray prior to the procedure for anesthesia requirements.      Allison L. Rennis Harding, N.P.      Lyn Records, M.D.  Electronically Signed   ALE/MEDQ  D:  05/05/2005  T:  05/06/2005  Job:  [id number]   cc:   Theressa Millard, M.D.  Fax: [phone]

## 2010-10-02 NOTE — H&P (Signed)
NAME:  NYHEEM, BINETTE NO.:  [id number]   MEDICAL RECORD NO.:  [id number]          PATIENT TYPE:  INP   LOCATION:  4728                         FACILITY:  [facility]   PHYSICIAN:  Elmore Guise., M.D.DATE OF BIRTH:  06/22/1960   DATE OF ADMISSION:  05/30/2006  DATE OF DISCHARGE:                              HISTORY & PHYSICAL   PRIMARY CARDIOLOGIST:  Dr. Garnette Scheuermann.   REASON FOR ADMISSION:  Pain control with iliopsoas hematoma.   HISTORY OF PRESENT ILLNESS:  Mr. Hintz is a 50 year old white male with  past medical history of paroxysmal atrial fibrillation (status post  atrial fibrillation ablation, May 24, 2005, at [facility])  who presents with increased left-sided hip, flank pain for the last 2 to  3 hours.  The patient reports normal state of health, and today actually  went for a clinic appointment with Dr. Katrinka Blazing this afternoon.  At that  time, he was without complaint; however, since leaving the office, he  has had increased pain, especially with ambulation, in his left hip and  flank area.  Now his pain is off and on with intermittent times of  intense pain and other times of lessened pain.  He denies any trauma to  the area.  He does report some swelling in and around his cath site on  both his right and left femoral areas but no significant changes from  this morning.  He states that his flank area feels swollen to him.  He  denies any significant abdominal pain, no shortness of breath, no chest  pain.  The patient was brought to the emergency room for further  evaluation and on arrival here, he had a CT of the abdomen and pelvis,  which showed a moderate-sized iliopsoas hematoma on the left and a mild  to moderate size pericardial effusion.  Currently, the patient is  resting without significant complaint.  His last dose of Lovenox was  this morning.  He has not taken his Coumadin today.   REVIEW OF SYSTEMS:  As per HPI or otherwise  negative.   CURRENT MEDICATIONS:  1. Atenolol 25 mg daily.  2. Flecainide 150 mg twice daily.  3. Coumadin.  4. Lovenox 100 mg daily, last dose was this morning.   ALLERGIES:  OPIOIDS.   FAMILY HISTORY:  Noncontributory.   SOCIAL HISTORY:  Denies tobacco, does have occasional alcohol use with  occasional red and white wines.   EXAMINATION:  He is afebrile.  Blood pressure is 163/94, heart rate is 66 and regular.  He is  saturating 99% on room air.  GENERAL:  He is a very pleasant middle-aged white male, alert and  oriented x4 in mild distress.  HEENT:  Normal.  NECK:  Supple, no lymphadenopathy; 2+ carotids, no JVD, no bruits.  LUNGS:  Clear.  HEART:  Regular with no rub noted.  ABDOMEN:  Soft with mild distention, no rebound or guarding.  Normoactive bowel sounds noted.   His femoral areas show moderate-sized ecchymosis in both the left and  right femoral areas.  He  has no significant swelling.  He does have  significant pain to palpation in his left leg extending down to his left  pelvic area and hip.  He has no significant lower extremity edema with  2+ pulses noted bilaterally.  His CT of the abdomen and pelvis shows a  moderate-sized iliopsoas hematoma with no evidence of tracking from a  vascular access point likely consistent with spontaneous bleed.  He also  has a small-to-moderate pericardial effusion also noted.   His white count is 6.6, hemoglobin is 13.6, platelet count is 185.  He  has had a PT of 17.1, an INR of 1.4, a PTT of 46.  His BUN and  creatinine are 17 and 1.1.  His potassium is 4.0.  His AST is 138 likely  secondary to muscle injury.  His ALT is also elevated at 297.   IMPRESSION:  1. Iliopsoas hematoma, likely secondary to Coumadin and Lovenox.  2. History of paroxysmal atrial fibrillation status post ablation      therapy on May 24, 2005, now with small-to-moderate pericardial      effusion.   PLAN:  The patient will be admitted to telemetry  bed.  We will give  vitamin K 1 mg p.o. x1 now.  We will hold his Lovenox and Coumadin.  He  will have an echocardiogram in the a.m.  He will have pain control with  Toradol and Ultram because of his opioid reaction in the past.  We will  recheck a CMP, PT, INR, and PTT in the morning, as well as a CBC.  I  will continue his atenolol and flecainide and to check an EKG to follow  on QT interval.  Further measures per Dr. Garnette Scheuermann.      Elmore Guise., M.D.  Electronically Signed     TWK/MEDQ  D:  05/30/2006  T:  05/31/2006  Job:  [id number]

## 2010-10-02 NOTE — Op Note (Signed)
NAME:  MADOX, CORKINS NO.:  [id number]   MEDICAL RECORD NO.:  [id number]          PATIENT TYPE:  AMB   LOCATION:  ENDO                         FACILITY:  [facility]   PHYSICIAN:  Jonelle Sidle, MD DATE OF BIRTH:  1960-09-17   DATE OF PROCEDURE:  DATE OF DISCHARGE:                                 OPERATIVE REPORT   DIRECT CURRENT CARDIOVERSION REPORT:   REQUESTING PHYSICIAN:  Duke Salvia, MD, [facility]   INDICATIONS:  Tony [name] is a 50 year old male with a history of atrial  fibrillation documented back in November 2005 initially.  He has failed  sotalol therapy and is now on flecainide, which was recently increased to  150 mg twice daily due to recurrent atrial fibrillation.  He was initially  scheduled for repeat cardioversion attempt last week; however, his INR was  noted to be subtherapeutic at 1.7.  He is rescheduled for today with re-  documentation of INR, now in therapeutic range of 2.1.  He is scheduled for  a transesophageal echocardiogram-guided cardioversion.  The potential risks  and benefits were explained to him in advance, and informed consent was  obtained.   A transesophageal echocardiogram report is completed elsewhere.  In summary,  no obvious left atrial or right atrial appendage thrombi were noted.  No  other intracardiac thrombi were identified.   Using standard anterior and posterior pad placement, a single shock with 150  joules using a biphasic defibrillator was delivered with successful  restoration of normal sinus rhythm in the 60s.  Anesthesia was present  during the procedure.  There were no immediate complications.      Jonelle Sidle, MD  Electronically Signed     SGM/MEDQ  D:  03/02/2006  T:  03/02/2006  Job:  [id number]   cc:   Duke Salvia, MD, [facility]

## 2010-10-02 NOTE — Cardiovascular Report (Signed)
NAME:  Tony [name], MCWHIRTER NO.:  [id number]   MEDICAL RECORD NO.:  [id number]          PATIENT TYPE:  OIB   LOCATION:  2856                         FACILITY:  [facility]   PHYSICIAN:  Lyn Records, M.D.   DATE OF BIRTH:  1961/01/31   DATE OF PROCEDURE:  05/05/2005  DATE OF DISCHARGE:  05/05/2005                              CARDIAC CATHETERIZATION   INDICATION FOR PROCEDURE:  Atrial fibrillation.   PROCEDURE PERFORMED:  Electrical cardioversion.   DESCRIPTION:  Dr. Sheldon Silvan in association with nurse anesthetist gave  propofol intravenously 165 milligrams. After the patient was asleep, using  the synchronize mode 200 joules of biphasic energy was discharge with  reversion of the atrial fibrillation to sinus bradycardia rate of 56 beats  per minute. The patient awakened without complications. No evidence of  systemic embolization was noted.   CONCLUSION:  Successful conversion from atrial fibrillation to sinus  bradycardia.   PLAN:  Increase sotalol to 160 milligrams twice a day, EKG in one week and  continue. Coumadin call if problem.      Lyn Records, M.D.  Electronically Signed     HWS/MEDQ  D:  05/05/2005  T:  05/06/2005  Job:  [id number]   cc:   Theressa Millard, M.D.  Fax: [phone]

## 2010-10-02 NOTE — Letter (Signed)
December 30, 2005     Shawnie Pons, M.D.  [facility]  [address]  [place], [place]  [address]   RE:  Tony [name], Tony [name]  MRN:  [id number]  /  DOB:  16-Jun-1960   Dear Dorene Grebe,   Mr. Crutchfield underwent flecainide loading at 100 mg twice daily and then  underwent cardioversion yesterday.  We submitted him for treadmill testing  today.  He went into a modified stage IV with an appropriate increase in  blood pressure.  His maximum heart rate was 137, which was accomplished on  his atenolol at 50 mg a day.   He did quite well.   I know he is scheduled for sometime in January.  We will plan to see him  again sometime in the fall to just make sure everything is going okay.   Hope you are doing well and look forward to seeing you when next the  opportunity presents.   Sincerely,     Duke Salvia, MD, [facility]   SCK/MedQ  DD:  12/30/2005  DT:  12/30/2005  Job #:  [id number]   CC:    Theressa Millard, MD

## 2010-11-02 ENCOUNTER — Encounter (INDEPENDENT_AMBULATORY_CARE_PROVIDER_SITE_OTHER): Payer: Self-pay | Admitting: General Surgery

## 2010-11-10 ENCOUNTER — Encounter (INDEPENDENT_AMBULATORY_CARE_PROVIDER_SITE_OTHER): Payer: 59 | Admitting: General Surgery

## 2010-11-30 ENCOUNTER — Telehealth (INDEPENDENT_AMBULATORY_CARE_PROVIDER_SITE_OTHER): Payer: Self-pay

## 2010-11-30 NOTE — Progress Notes (Signed)
Encounter opened in error and patient canceled appt for 7/18.

## 2010-11-30 NOTE — Patient Instructions (Signed)
Error

## 2010-12-02 ENCOUNTER — Encounter (INDEPENDENT_AMBULATORY_CARE_PROVIDER_SITE_OTHER): Payer: 59 | Admitting: General Surgery

## 2010-12-22 ENCOUNTER — Telehealth (INDEPENDENT_AMBULATORY_CARE_PROVIDER_SITE_OTHER): Payer: Self-pay | Admitting: General Surgery

## 2010-12-23 NOTE — Telephone Encounter (Signed)
Tony [name] and [place] If he is fine he does not need to see me.  If he has any other issues have him call. MW

## 2011-03-16 ENCOUNTER — Encounter (INDEPENDENT_AMBULATORY_CARE_PROVIDER_SITE_OTHER): Payer: Self-pay | Admitting: Surgery

## 2011-03-16 ENCOUNTER — Ambulatory Visit (INDEPENDENT_AMBULATORY_CARE_PROVIDER_SITE_OTHER): Payer: Commercial Indemnity | Admitting: Surgery

## 2011-03-16 DIAGNOSIS — K429 Umbilical hernia without obstruction or gangrene: Secondary | ICD-10-CM

## 2011-03-16 DIAGNOSIS — K439 Ventral hernia without obstruction or gangrene: Secondary | ICD-10-CM

## 2011-03-16 DIAGNOSIS — K432 Incisional hernia without obstruction or gangrene: Secondary | ICD-10-CM | POA: Insufficient documentation

## 2011-03-16 HISTORY — DX: Umbilical hernia without obstruction or gangrene: K42.9

## 2011-03-16 NOTE — Patient Instructions (Signed)
Hernia °A hernia occurs when an internal organ pushes out through a weak spot in the abdominal wall. Hernias most commonly occur in the groin and around the navel. Hernias often can be pushed back into place (reduced). Most hernias tend to get worse over time. Some abdominal hernias can get stuck in the opening (irreducible or incarcerated hernia) and cannot be reduced. An irreducible abdominal hernia which is tightly squeezed into the opening is at risk for impaired blood supply (strangulated hernia). A strangulated hernia is a medical emergency. Because of the risk for an irreducible or strangulated hernia, surgery may be recommended to repair a hernia. °CAUSES  °· Heavy lifting.  °· Prolonged coughing.  °· Straining to have a bowel movement.  °· A cut (incision) made during an abdominal surgery.  °HOME CARE INSTRUCTIONS  °· Bed rest is not required. You may continue your normal activities.  °· Avoid lifting more than 10 pounds (4.5 kg) or straining.  °· Cough gently. If you are a smoker it is best to stop. Even the best hernia repair can break down with the continual strain of coughing. Even if you do not have your hernia repaired, a cough will continue to aggravate the problem.  °· Do not wear anything tight over your hernia. Do not try to keep it in with an outside bandage or truss. These can damage abdominal contents if they are trapped within the hernia sac.  °· Eat a normal diet.  °· Avoid constipation. Straining over long periods of time will increase hernia size and encourage breakdown of repairs. If you cannot do this with diet alone, stool softeners may be used.  °SEEK IMMEDIATE MEDICAL CARE IF:  °· You have a fever.  °· You develop increasing abdominal pain.  °· You feel nauseous or vomit.  °· Your hernia is stuck outside the abdomen, looks discolored, feels hard, or is tender.  °· You have any changes in your bowel habits or in the hernia that are unusual for you.  °· You have increased pain or  swelling around the hernia.  °· You cannot push the hernia back in place by applying gentle pressure while lying down.  °MAKE SURE YOU:  °· Understand these instructions.  °· Will watch your condition.  °· Will get help right away if you are not doing well or get worse.  °Document Released: 05/03/2005 Document Revised: 01/13/2011 Document Reviewed: 12/21/2007 °[employer]® Patient Information ©2012 [employer]. °

## 2011-03-16 NOTE — Progress Notes (Signed)
Subjective:     Patient ID: Tony [name], male   DOB: [birth year], 50 y.o.   MRN: [id number]  HPI  Patient Care Team: Darnelle Bos, MD as PCP - General (Internal Medicine)  This patient is a 50 y.o.male who presents today for surgical evaluation.   Reason for visit: Pain and bellybutton. Question of recurrent hernia.  Patient is a pleasant male that I did a laparoscopic appendectomy on in January 2010. He also had an umbilical hernia. I primarily repaired it at that time.  Since that time, he has intentionally lost 65 pounds through aggressive exercise. He strained to keep weight down. He does weight training and other activities. He is noted for several months and he's got pain and popping feeling at his bellybutton. He feels like the hernia has come back. No fevers chills or sweats. He has had 2 painful episodes but able to reduce it down. He mentioned it to his primary care physician. Dr. Earl Gala send the patient to [place] to consider if surgery is needed.  Past Medical History  Diagnosis Date  . Hypertension   . Sleep apnea   . A-fib 2008  . Ventral hernia     Past Surgical History  Procedure Date  . Ablation saphenous vein w/ rfa 2008  . Hernia repair 1968    RIH  . Hernia repair 2010    Primary umb hernia w appy  . Appendectomy Jan 2010    lap w Primary UHR    History   Social History  . Marital Status: Married    Spouse Name: N/A    Number of Children: N/A  . Years of Education: N/A   Occupational History  . Not on file.   Social History Main Topics  . Smoking status: Never Smoker   . Smokeless tobacco: Never Used  . Alcohol Use: 1.0 oz/week    2 drink(s) per week  . Drug Use: No  . Sexually Active:    Other Topics Concern  . Not on file   Social History Narrative  . No narrative on file    Family History  Problem Relation Age of Onset  . Cancer Mother   . Heart disease Mother   . Cancer Father     Current outpatient  prescriptions:LISINOPRIL PO, Take by mouth daily.  , Disp: , Rfl:   No Known Allergies     Review of Systems  Constitutional: Negative for fever, chills and diaphoresis.  HENT: Negative for nosebleeds, sore throat, facial swelling, mouth sores, trouble swallowing and ear discharge.   Eyes: Negative for photophobia, discharge and visual disturbance.  Respiratory: Negative for choking, chest tightness, shortness of breath and stridor.   Cardiovascular: Negative for chest pain and palpitations.  Gastrointestinal: Negative for nausea, vomiting, diarrhea, constipation, blood in stool, abdominal distention, anal bleeding and rectal pain.  Genitourinary: Negative for dysuria, urgency, difficulty urinating and testicular pain.  Musculoskeletal: Negative for myalgias, back pain, arthralgias and gait problem.  Skin: Negative for color change, pallor, rash and wound.  Neurological: Negative for dizziness, speech difficulty, weakness, numbness and headaches.  Hematological: Negative for adenopathy. Does not bruise/bleed easily.  Psychiatric/Behavioral: Negative for hallucinations, confusion and agitation.       Objective:   Physical Exam  Constitutional: He is oriented to person, place, and time. He appears well-developed and well-nourished. No distress.  HENT:  Head: Normocephalic.  Mouth/Throat: Oropharynx is clear and moist. No oropharyngeal exudate.  Eyes: Conjunctivae and EOM are normal. Pupils are  equal, round, and reactive to light. No scleral icterus.  Neck: Normal range of motion. Neck supple. No tracheal deviation present.  Cardiovascular: Normal rate, regular rhythm and intact distal pulses.   Pulmonary/Chest: Effort normal and breath sounds normal. No respiratory distress.  Abdominal: Soft. He exhibits no distension. There is no tenderness. There is no rebound and no guarding. Hernia confirmed negative in the right inguinal area and confirmed negative in the left inguinal area.        1cm umb hernia.  Supraumbilical laxity, poss hernia also  Musculoskeletal: Normal range of motion. He exhibits no tenderness.  Lymphadenopathy:    He has no cervical adenopathy.       Right: No inguinal adenopathy present.       Left: No inguinal adenopathy present.  Neurological: He is alert and oriented to person, place, and time. No cranial nerve deficit. He exhibits normal muscle tone. Coordination normal.  Skin: Skin is warm and dry. No rash noted. He is not diaphoretic. No erythema. No pallor.  Psychiatric: He has a normal mood and affect. His behavior is normal. Judgment and thought content normal.       Assessment:     Small recurrent umbilical hernia with possible supraumbilical hernia also.    Plan:     Because he's had recurrence and is affecting his ability to work and exercise, I suspect this is going to need need to be repaired at some point. The longer he waits today are going to get his artery had 2 bouts of symptoms. He is leaning towards having it repaired.   Given his aggressive activity and large body habitus, I think it would be helpful to fix with mesh. Because of concern he may have more than one location, I think diagnostic laparoscopy would be helpful to map out the area(s). Probably do an underlay repair with Parietex/Seprafilm mesh to minimize adhesions.  The anatomy & physiology of the abdominal wall was discussed.  The pathophysiology of hernias was discussed.  Natural history risks without surgery of enlargement, pain, incarceration & strangulation was discussed.   Contributors to complications such as smoking, obesity, diabetes, prior surgery, etc were discussed.  I feel the risks of no intervention will lead to serious problems that outweigh the operative risks; therefore, I recommended surgery to reduce and repair the hernia.  I explained laparoscopic techniques with possible need for an open approach.  I noted the probable use of mesh to patch and/or buttress  hernia repair  Risks such as bleeding, infection, abscess, need for further treatment, heart attack, death, and other risks were discussed.  Goals of post-operative recovery were discussed as well.  Possibility that this will not correct all symptoms was explained.  I stressed the importance of low-impact activity, aggressive pain control, avoiding constipation, & not pushing through pain to minimize risk of post-operative chronic pain or injury. Possibility of reherniation was discussed.  We will work to minimize complications.   An educational handout further explaining the pathology & treatment options was given as well.  Questions were answered.  The patient expresses understanding & wishes to proceed with surgery.

## 2011-12-10 ENCOUNTER — Other Ambulatory Visit: Payer: Self-pay | Admitting: Internal Medicine

## 2011-12-10 DIAGNOSIS — R059 Cough, unspecified: Secondary | ICD-10-CM

## 2011-12-10 DIAGNOSIS — R05 Cough: Secondary | ICD-10-CM

## 2011-12-10 DIAGNOSIS — G44009 Cluster headache syndrome, unspecified, not intractable: Secondary | ICD-10-CM

## 2011-12-17 ENCOUNTER — Ambulatory Visit
Admission: RE | Admit: 2011-12-17 | Discharge: 2011-12-17 | Disposition: A | Discharge status: Home / Self Care | Payer: Commercial Indemnity | Source: Ambulatory Visit | Attending: Internal Medicine | Admitting: Internal Medicine

## 2011-12-17 DIAGNOSIS — R059 Cough, unspecified: Secondary | ICD-10-CM

## 2011-12-17 DIAGNOSIS — R05 Cough: Secondary | ICD-10-CM

## 2011-12-17 DIAGNOSIS — G44009 Cluster headache syndrome, unspecified, not intractable: Secondary | ICD-10-CM

## 2011-12-17 MED ORDER — GADOBENATE DIMEGLUMINE 529 MG/ML IV SOLN
20.0000 mL | Freq: Once | INTRAVENOUS | Status: AC | PRN
  Administered 2011-12-17: 20 mL via INTRAVENOUS

## 2012-03-13 ENCOUNTER — Telehealth (INDEPENDENT_AMBULATORY_CARE_PROVIDER_SITE_OTHER): Payer: Self-pay | Admitting: General Surgery

## 2012-03-13 NOTE — Telephone Encounter (Signed)
Patient wanted to see what Dr Gordy Savers surgery schedule looked like prior to seeing him Monday to have an idea of when his surgery could be scheduled. I transferred patient to our surgery scheduling department.

## 2012-03-13 NOTE — Telephone Encounter (Signed)
Message copied by Liliana Cline on Mon Mar 13, 2012  2:22 PM ------      Message from: Erin Sons      Created: Mon Mar 13, 2012 10:48 AM      Regarding: Dr Michaell Cowing      Contact: [phone]       Pt has question concerning his surgery.            Thanks

## 2012-03-27 ENCOUNTER — Encounter (INDEPENDENT_AMBULATORY_CARE_PROVIDER_SITE_OTHER): Payer: Self-pay | Admitting: Surgery

## 2012-03-27 ENCOUNTER — Ambulatory Visit (INDEPENDENT_AMBULATORY_CARE_PROVIDER_SITE_OTHER): Payer: Commercial Indemnity | Admitting: Surgery

## 2012-03-27 VITALS — BP 140/76 | HR 52 | Temp 98.6°F | Resp 18 | Ht 76.0 in | Wt 231.2 lb

## 2012-03-27 DIAGNOSIS — K429 Umbilical hernia without obstruction or gangrene: Secondary | ICD-10-CM

## 2012-03-27 DIAGNOSIS — K439 Ventral hernia without obstruction or gangrene: Secondary | ICD-10-CM

## 2012-03-27 NOTE — Patient Instructions (Addendum)
See the Handout(s) we gave you.  Consider surgery.  Please call our office at [phone] if you wish to schedule surgery or if you have further questions / concerns.   Hernia Repair with Laparoscope A hernia occurs when an internal organ pushes out through a weak spot in the belly (abdominal) wall muscles. Hernias most commonly occur in the groin and around the navel. Hernias can also occur through a cut by the surgeon (incision) after an abdominal operation. A hernia may be caused by:  Lifting heavy objects.  Prolonged coughing.  Straining to move your bowels. Hernias can often be pushed back into place (reduced). Most hernias tend to get worse over time. Problems occur when abdominal contents get stuck in the opening and the blood supply is blocked or impaired (incarcerated hernia). Because of these risks, you require surgery to repair the hernia. Your hernia will be repaired using a laparoscope. Laparoscopic surgery is a type of minimally invasive surgery. It does not involve making a typical surgical cut (incision) in the skin. A laparoscope is a telescope-like rod and lens system. It is usually connected to a video camera and a light source so your caregiver can clearly see the operative area. The instruments are inserted through  to  inch (5 mm or 10 mm) openings in the skin at specific locations. A working and viewing space is created by blowing a small amount of carbon dioxide gas into the abdominal cavity. The abdomen is essentially blown up like a balloon (insufflated). This elevates the abdominal wall above the internal organs like a dome. The carbon dioxide gas is common to the human body and can be absorbed by tissue and removed by the respiratory system. Once the repair is completed, the small incisions will be closed with either stitches (sutures) or staples (just like a paper stapler only this staple holds the skin together). LET YOUR CAREGIVERS KNOW  ABOUT:  Allergies.  Medications taken including herbs, eye drops, over the counter medications, and creams.  Use of steroids (by mouth or creams).  Previous problems with anesthetics or Novocaine.  Possibility of pregnancy, if this applies.  History of blood clots (thrombophlebitis).  History of bleeding or blood problems.  Previous surgery.  Other health problems. BEFORE THE PROCEDURE  Laparoscopy can be done either in a hospital or out-patient clinic. You may be given a mild sedative to help you relax before the procedure. Once in the operating room, you will be given a general anesthesia to make you sleep (unless you and your caregiver choose a different anesthetic).  AFTER THE PROCEDURE  After the procedure you will be watched in a recovery area. Depending on what type of hernia was repaired, you might be admitted to the hospital or you might go home the same day. With this procedure you may have less pain and scarring. This usually results in a quicker recovery and less risk of infection. HOME CARE INSTRUCTIONS   Bed rest is not required. You may continue your normal activities but avoid heavy lifting (more than 10 pounds) or straining.  Cough gently. If you are a smoker it is best to stop, as even the best hernia repair can break down with the continual strain of coughing.  Avoid driving until given the OK by your surgeon.  There are no dietary restrictions unless given otherwise.  TAKE ALL MEDICATIONS AS DIRECTED.  Only take over-the-counter or prescription medicines for pain, discomfort, or fever as directed by your caregiver. SEEK MEDICAL CARE   IF:   There is increasing abdominal pain or pain in your incisions.  There is more bleeding from incisions, other than minimal spotting.  You feel light headed or faint.  You develop an unexplained fever, chills, and/or an oral temperature above 102 F (38.9 C).  You have redness, swelling, or increasing pain in the  wound.  Pus coming from wound.  A foul smell coming from the wound or dressings. SEEK IMMEDIATE MEDICAL CARE IF:   You develop a rash.  You have difficulty breathing.  You have any allergic problems. MAKE SURE YOU:   Understand these instructions.  Will watch your condition.  Will get help right away if you are not doing well or get worse. Document Released: 05/03/2005 Document Revised: 07/26/2011 Document Reviewed: 04/02/2009 Exit[employer] Patient Information 2013 [employer].  

## 2012-03-27 NOTE — Progress Notes (Signed)
Subjective:     Patient ID: Tony [name], male   DOB: 1961/01/19, 51 y.o.   MRN: [id number]  HPI  Tony [name]  1960-09-18 [id number]  Patient Care Team: Darnelle Bos, MD as PCP - General (Internal Medicine)  This patient is a 51 y.o.male who presents today for surgical evaluation.   Reason for visit: Consider repair of recurrent hernia near her bellybutton.  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  Underwent emergency appendectomy a few years ago.  Had an umbilical hernia that I repaired.  It recurred.  I saw him last year.  I recommend he consider surgery.  I thought there was an area supraumbilically as well.  Recommended diagnostic laparoscopy to map out area with possible underlying repair.  He tabled having surgery.  He had to have a pulmonary vein isolation done at [facility].  He is feeling better.  Back to work.  Interested in getting surgery done.  The area is slightly larger.  He is often sensitive and he notices that it is there.  Does not have any severe pain with it.  Urinating fine.  Regular bowel movements every day.  Works out in [employer]  Wants to make sure it is safe to exercise after surgery.  Patient Active Problem List  Diagnosis  . Recurrent umbilical hernia  . ? Epigastric hernia    Past Medical History  Diagnosis Date  . Hypertension   . Sleep apnea   . A-fib 2008  . Ventral hernia   . Hemorrhoid thrombosis 2012    self -resolved    Past Surgical History  Procedure Date  . Ablation saphenous vein w/ rfa 2008  . Hernia repair 1968    RIH  . Hernia repair 2010    Primary umb hernia w appy  . Appendectomy Jan 2010    lap w Primary UHR    History   Social History  . Marital Status: Married    Spouse Name: N/A    Number of Children: N/A  . Years of Education: N/A   Occupational History  . Not on file.   Social History Main Topics  . Smoking status: Never Smoker   . Smokeless tobacco: Never Used  . Alcohol Use: 1.0 oz/week    2 drink(s) per  week  . Drug Use: No  . Sexually Active:    Other Topics Concern  . Not on file   Social History Narrative  . No narrative on file    Family History  Problem Relation Age of Onset  . Cancer Mother   . Heart disease Mother   . Cancer Father     Current Outpatient Prescriptions  Medication Sig Dispense Refill  . B Complex-C (B-COMPLEX WITH VITAMIN C) tablet Take 1 tablet by mouth daily.      . cholecalciferol (VITAMIN D) 1000 UNITS tablet Take 2,000 Units by mouth daily.      Marland Kitchen LISINOPRIL PO Take by mouth daily.           No Known Allergies  BP 140/76  Pulse 52  Temp 98.6 F (37 C) (Temporal)  Resp 18  Ht 6\' 4"  (1.93 m)  Wt 231 lb 3.2 oz (104.872 kg)  BMI 28.14 kg/m2  No results found.   Review of Systems  Constitutional: Negative for fever, chills and diaphoresis.  HENT: Negative for sore throat, trouble swallowing and neck pain.   Eyes: Negative for photophobia and visual disturbance.  Respiratory: Negative for choking and shortness of breath.  Cardiovascular: Negative for chest pain and palpitations.  Gastrointestinal: Negative for nausea, vomiting, abdominal distention, anal bleeding and rectal pain.  Genitourinary: Negative for dysuria, urgency, difficulty urinating and testicular pain.  Musculoskeletal: Negative for myalgias, arthralgias and gait problem.  Skin: Negative for color change and rash.  Neurological: Negative for dizziness, speech difficulty, weakness and numbness.  Hematological: Negative for adenopathy.  Psychiatric/Behavioral: Negative for hallucinations, confusion and agitation.       Objective:   Physical Exam  Constitutional: He is oriented to person, place, and time. He appears well-developed and well-nourished. No distress.  HENT:  Head: Normocephalic.  Mouth/Throat: Oropharynx is clear and moist. No oropharyngeal exudate.  Eyes: Conjunctivae normal and EOM are normal. Pupils are equal, round, and reactive to light. No scleral  icterus.  Neck: Normal range of motion. No tracheal deviation present.  Cardiovascular: Normal rate, normal heart sounds and intact distal pulses.   Pulmonary/Chest: Effort normal. No respiratory distress.  Abdominal: Soft. He exhibits no distension. There is no tenderness. There is no CVA tenderness. A hernia is present. Hernia confirmed positive in the ventral area. Hernia confirmed negative in the right inguinal area and confirmed negative in the left inguinal area.         Lap incisions clean with normal healing ridges.   Genitourinary:       Groins sensitive to light touch but no hernias.  Musculoskeletal: Normal range of motion. He exhibits no tenderness.  Neurological: He is alert and oriented to person, place, and time. No cranial nerve deficit. He exhibits normal muscle tone. Coordination normal.  Skin: Skin is warm and dry. No rash noted. He is not diaphoretic.  Psychiatric: He has a normal mood and affect. His behavior is normal.       Assessment:     Recurrent periumbilical ventral hernia with probable small epigastric ventral hernia.  Slightly larger.    Plan:     Given his physical activity and enlarging size, I recommended surgery to repair the hernias.  Would start out laparoscopically to make sure the area is well mouth out.  Hopefully just an outpatient procedure.  I did caution him that he will be out a couple of weeks with surgery.  This is not a quick pain-free procedure.  The anatomy & physiology of the abdominal wall was discussed.  The pathophysiology of hernias was discussed.  Natural history risks without surgery including progeressive enlargement, pain, incarceration & strangulation was discussed.   Contributors to complications such as smoking, obesity, diabetes, prior surgery, etc were discussed.   I feel the risks of no intervention will lead to serious problems that outweigh the operative risks; therefore, I recommended surgery to reduce and repair the  hernia.  I explained laparoscopic techniques with possible need for an open approach.  I noted the probable use of mesh to patch and/or buttress the hernia repair  Risks such as bleeding, infection, abscess, need for further treatment, heart attack, death, and other risks were discussed.  I noted a good likelihood this will help address the problem.   Goals of post-operative recovery were discussed as well.  Possibility that this will not correct all symptoms was explained.  I stressed the importance of low-impact activity, aggressive pain control, avoiding constipation, & not pushing through pain to minimize risk of post-operative chronic pain or injury. Possibility of reherniation especially with smoking, obesity, diabetes, immunosuppression, and other health conditions was discussed.  We will work to minimize complications.     An educational  handout further explaining the pathology & treatment options was given as well.  Questions were answered.  The patient expresses understanding & wishes to proceed with surgery.

## 2012-06-25 ENCOUNTER — Emergency Department (HOSPITAL_COMMUNITY): Payer: Managed Care, Other (non HMO)

## 2012-06-25 ENCOUNTER — Encounter (HOSPITAL_COMMUNITY): Payer: Self-pay | Admitting: Nurse Practitioner

## 2012-06-25 ENCOUNTER — Emergency Department (HOSPITAL_COMMUNITY)
Admission: EM | Admit: 2012-06-25 | Discharge: 2012-06-25 | Disposition: A | Discharge status: Home / Self Care | Payer: Managed Care, Other (non HMO) | Attending: Emergency Medicine | Admitting: Emergency Medicine

## 2012-06-25 DIAGNOSIS — Z8679 Personal history of other diseases of the circulatory system: Secondary | ICD-10-CM | POA: Insufficient documentation

## 2012-06-25 DIAGNOSIS — K429 Umbilical hernia without obstruction or gangrene: Secondary | ICD-10-CM | POA: Insufficient documentation

## 2012-06-25 DIAGNOSIS — Z79899 Other long term (current) drug therapy: Secondary | ICD-10-CM | POA: Insufficient documentation

## 2012-06-25 DIAGNOSIS — G4733 Obstructive sleep apnea (adult) (pediatric): Secondary | ICD-10-CM | POA: Insufficient documentation

## 2012-06-25 DIAGNOSIS — Z8719 Personal history of other diseases of the digestive system: Secondary | ICD-10-CM | POA: Insufficient documentation

## 2012-06-25 DIAGNOSIS — I10 Essential (primary) hypertension: Secondary | ICD-10-CM | POA: Insufficient documentation

## 2012-06-25 LAB — CBC WITH DIFFERENTIAL/PLATELET
Basophils Absolute: 0 10*3/uL (ref 0.0–0.1)
Basophils Relative: 1 % (ref 0–1)
Eosinophils Absolute: 0.1 10*3/uL (ref 0.0–0.7)
Eosinophils Relative: 2 % (ref 0–5)
HCT: 39.1 % (ref 39.0–52.0)
Lymphocytes Relative: 43 % (ref 12–46)
MCHC: 35.5 g/dL (ref 30.0–36.0)
MCV: 90.9 fL (ref 78.0–100.0)
Monocytes Absolute: 0.3 10*3/uL (ref 0.1–1.0)
Platelets: 127 10*3/uL — ABNORMAL LOW (ref 150–400)
RDW: 11.6 % (ref 11.5–15.5)
WBC: 3.3 10*3/uL — ABNORMAL LOW (ref 4.0–10.5)

## 2012-06-25 LAB — URINALYSIS, MICROSCOPIC ONLY
Bilirubin Urine: NEGATIVE
Ketones, ur: NEGATIVE mg/dL
Leukocytes, UA: NEGATIVE
Nitrite: NEGATIVE
Protein, ur: NEGATIVE mg/dL
Urobilinogen, UA: 0.2 mg/dL (ref 0.0–1.0)

## 2012-06-25 LAB — COMPREHENSIVE METABOLIC PANEL
ALT: 43 U/L (ref 0–53)
Albumin: 4 g/dL (ref 3.5–5.2)
Alkaline Phosphatase: 67 U/L (ref 39–117)
BUN: 12 mg/dL (ref 6–23)
Chloride: 105 mEq/L (ref 96–112)
Glucose, Bld: 101 mg/dL — ABNORMAL HIGH (ref 70–99)
Potassium: 3.9 mEq/L (ref 3.5–5.1)
Sodium: 142 mEq/L (ref 135–145)
Total Bilirubin: 0.6 mg/dL (ref 0.3–1.2)

## 2012-06-25 MED ORDER — OXYCODONE-ACETAMINOPHEN 5-325 MG PO TABS: 1.0000 | ORAL_TABLET | Freq: Four times a day (QID) | ORAL | Status: DC | PRN

## 2012-06-25 MED ORDER — IOHEXOL 300 MG/ML  SOLN
50.0000 mL | Freq: Once | INTRAMUSCULAR | Status: AC | PRN
  Administered 2012-06-25: 50 mL via ORAL

## 2012-06-25 MED ORDER — HYDROMORPHONE HCL PF 1 MG/ML IJ SOLN
1.0000 mg | Freq: Once | INTRAMUSCULAR | Status: AC
  Administered 2012-06-25: 1 mg via INTRAVENOUS
  Filled 2012-06-25: qty 1

## 2012-06-25 MED ORDER — OXYCODONE-ACETAMINOPHEN 5-325 MG PO TABS
2.0000 | ORAL_TABLET | Freq: Once | ORAL | Status: AC
  Administered 2012-06-25: 2 via ORAL
  Filled 2012-06-25: qty 2

## 2012-06-25 MED ORDER — ONDANSETRON HCL 4 MG/2ML IJ SOLN
4.0000 mg | Freq: Once | INTRAMUSCULAR | Status: AC
  Administered 2012-06-25: 4 mg via INTRAVENOUS
  Filled 2012-06-25: qty 2

## 2012-06-25 MED ORDER — IOHEXOL 300 MG/ML  SOLN
100.0000 mL | Freq: Once | INTRAMUSCULAR | Status: AC | PRN
  Administered 2012-06-25: 100 mL via INTRAVENOUS

## 2012-06-25 NOTE — ED Provider Notes (Signed)
Medical screening examination/treatment/procedure(s) were conducted as a shared visit with non-physician practitioner(s) and myself.  I personally evaluated the patient during the encounter  Tenderness at the periumbilical hernia site. Unable to reduce secondary to patient discomfort. No vomiting or stool changes.   Glynn Octave, MD 06/25/12 [phone]

## 2012-06-25 NOTE — ED Provider Notes (Signed)
History     CSN: [id number]  Arrival date & time 06/25/12  1011   First MD Initiated Contact with Patient 06/25/12 1109      Chief Complaint  Patient presents with  . Abdominal Pain    (Consider location/radiation/quality/duration/timing/severity/associated sxs/prior treatment) HPI Comments: Patient with history of microscopic appendectomy last year and subsequent umbilical hernia presents with acute onset of severe pain at his hernia site that started this morning. Pain was 3/10 at first however it has increased to 7/10. It has not been associated with fever, nausea, vomiting, diarrhea, urinary symptoms. No treatments prior to arrival. Patient called his PCP and was told to go to the emergency department for evaluation. Course is constant. Nothing makes symptoms better. Movement and palpation makes the pain worse.  Patient is a 52 y.o. male presenting with abdominal pain. The history is provided by the patient.  Abdominal Pain Associated symptoms: no chest pain, no cough, no diarrhea, no dysuria, no fever, no nausea, no sore throat and no vomiting     Past Medical History  Diagnosis Date  . Hypertension   . Sleep apnea   . A-fib 2008  . Ventral hernia   . Hemorrhoid thrombosis 2012    self -resolved    Past Surgical History  Procedure Laterality Date  . Ablation saphenous vein w/ rfa  2008  . Hernia repair  1968    RIH  . Hernia repair  2010    Primary umb hernia w appy  . Appendectomy  Jan 2010    lap w Primary UHR    Family History  Problem Relation Age of Onset  . Cancer Mother   . Heart disease Mother   . Cancer Father     History  Substance Use Topics  . Smoking status: Never Smoker   . Smokeless tobacco: Never Used  . Alcohol Use: 1.0 oz/week    2 drink(s) per week      Review of Systems  Constitutional: Negative for fever.  HENT: Negative for sore throat and rhinorrhea.   Eyes: Negative for redness.  Respiratory: Negative for cough.     Cardiovascular: Negative for chest pain.  Gastrointestinal: Positive for abdominal pain. Negative for nausea, vomiting and diarrhea.  Genitourinary: Negative for dysuria.  Musculoskeletal: Negative for myalgias.  Skin: Negative for rash.  Neurological: Negative for headaches.    Allergies  Review of patient's allergies indicates no known allergies.  Home Medications   Current Outpatient Rx  Name  Route  Sig  Dispense  Refill  . B Complex-C (B-COMPLEX WITH VITAMIN C) tablet   Oral   Take 1 tablet by mouth daily.         . cholecalciferol (VITAMIN D) 1000 UNITS tablet   Oral   Take 2,000 Units by mouth daily.         Marland Kitchen LISINOPRIL PO   Oral   Take by mouth daily.             BP 159/98  Pulse 60  Temp(Src) 97.4 F (36.3 C) (Oral)  Resp 22  SpO2 99%  Physical Exam  Nursing note and vitals reviewed. Constitutional: He appears well-developed and well-nourished.  HENT:  Head: Normocephalic and atraumatic.  Eyes: Conjunctivae are normal. Right eye exhibits no discharge. Left eye exhibits no discharge.  Neck: Normal range of motion. Neck supple.  Cardiovascular: Normal rate, regular rhythm and normal heart sounds.   Pulmonary/Chest: Effort normal and breath sounds normal.  Abdominal: Soft. He exhibits no  distension. There is tenderness in the periumbilical area. There is no rebound and no guarding.    Neurological: He is alert.  Skin: Skin is warm and dry.  Psychiatric: He has a normal mood and affect.    ED Course  Procedures (including critical care time)  Labs Reviewed  COMPREHENSIVE METABOLIC PANEL - Abnormal; Notable for the following:    Glucose, Bld 101 (*)    All other components within normal limits  CBC WITH DIFFERENTIAL - Abnormal; Notable for the following:    WBC 3.3 (*)    Platelets 127 (*)    Neutro Abs 1.5 (*)    All other components within normal limits  LIPASE, BLOOD  URINALYSIS, MICROSCOPIC ONLY  CG4 I-STAT (LACTIC ACID)   Ct  Abdomen Pelvis W Contrast  06/25/2012  *RADIOLOGY REPORT*  Clinical Data: Evaluate for umbilical hernia  CT ABDOMEN AND PELVIS WITH CONTRAST  Technique:  Multidetector CT imaging of the abdomen and pelvis was performed following the standard protocol during bolus administration of intravenous contrast.  Contrast: OMNIPAQUE IOHEXOL 300 MG/ML  SOLN  Comparison: Prior CT abdomen/pelvis 06/11/2008  Findings:  Lower Chest:  Trace dependent atelectasis in the lower lobes.  The lung bases are otherwise clear.  Visualized cardiac structures within normal limits for size.  No pericardial effusion.  The distal esophagus is within normal limits.  Abdomen: Unremarkable CT appearance of the stomach, duodenum, spleen, adrenal glands, pancreas, and liver. Gallbladder is unremarkable. No intra or extrahepatic biliary ductal dilatation.  Normal appearance of the kidneys.  No nephrolithiasis or hydronephrosis.  No solid renal mass.  Normal-caliber large and small bowel throughout the abdomen. Surgical staple line at the base of the cecum consistent with prior appendectomy.  No significant colonic diverticular disease.  Small omental fat containing umbilical hernia just left of midline at the umbilicus.  Pelvis: Unremarkable appearance of the seminal vesicles and bladder.  Mild prostatomegaly.  The prostate measures 5.1 cm in transverse dimension.  No free fluid or suspicious adenopathy.  Bones: No acute fracture or aggressive appearing lytic or blastic osseous lesion.  Vascular: Trace scattered atherosclerotic vascular calcifications without aneurysmal dilatation or evidence of significant stenosis.  IMPRESSION:  1.  Positive for a small left periumbilical hernia containing omental fat.  2.  Surgical changes of prior appendectomy.   Original Report Authenticated By: Malachy Moan, M.D.      1. Umbilical hernia     11:28 AM Patient seen and examined. Work-up initiated. Medications ordered. Pt d/w and seen by Dr.  Manus Gunning.   Vital signs reviewed and are as follows: Filed Vitals:   06/25/12 1014  BP: 159/98  Pulse: 60  Temp: 97.4 F (36.3 C)  Resp: 22   11:29 AM Spoke with Dr. Carolynne Edouard, will need CT to delineate between omental incarceration and bowel incarceration. Ordered. Patient informed.   CT showed omental fat within the hernia. Patient informed. He is feeling much better after IV pain medications. He is comfortable unless he pushes directly on the area. Will give oral Percocet and discharged home. Patient urged to return with fever, worsening severe pain, vomiting, if he is not passing gas, or if he has any other concerns. The patient and his wife verbalized understanding and agree with this plan.  Patient counseled on use of narcotic pain medications. Counseled not to combine these medications with others containing tylenol. Urged not to drink alcohol, drive, or perform any other activities that requires focus while taking these medications. The patient verbalizes  understanding and agrees with the plan.   MDM  Patient with hernia, upcoming repair. Omental fat within hernia. Do not suspect a strangulated or incarcerated bowel. No signs of obstruction. Pain is controlled in emergency department. Patient to followup with surgery, return if worsening.       Renne Crigler, [place] 06/25/12 1536

## 2012-06-25 NOTE — ED Notes (Signed)
Patient transported to CT 

## 2012-06-25 NOTE — ED Notes (Signed)
Pt with history of hernia since having appendectomy last year, woke this am with severe LLQ abd pain, called surgeon and they were concerned about incarcerated hernia due to severe pain and told pt to come immediately to er. Reports normal bowel/bladder.

## 2012-06-26 ENCOUNTER — Telehealth (INDEPENDENT_AMBULATORY_CARE_PROVIDER_SITE_OTHER): Payer: Self-pay

## 2012-06-26 NOTE — Telephone Encounter (Signed)
Returned pt's call from this am. The pt was concerned about his surgery that is scheduled for 07/06/12 b/c he ended up in the ER yesterday due to abdominal pain. The pt got a CT scan which only showed that he has the know hernia and it was not instrangulated. The pt was treated with pain medicine and sent home. The pt just wanted [place] to be aware of him going to the ER. I advised pt that I would tell Dr Michaell Cowing today.  I notified Dr Michaell Cowing of the pt going to the ER. Dr Michaell Cowing looked over the pt's records and viewed the CT scan. DR Michaell Cowing said nothing from his standpoint would change for the pt having surgery next week.  I lmom for the pt letting him know that Dr Michaell Cowing was notified.

## 2012-06-27 ENCOUNTER — Encounter (HOSPITAL_COMMUNITY): Payer: Self-pay | Admitting: Pharmacy Technician

## 2012-06-30 ENCOUNTER — Other Ambulatory Visit (HOSPITAL_COMMUNITY): Payer: Self-pay | Admitting: Surgery

## 2012-06-30 ENCOUNTER — Other Ambulatory Visit (INDEPENDENT_AMBULATORY_CARE_PROVIDER_SITE_OTHER): Payer: Self-pay | Admitting: Surgery

## 2012-06-30 NOTE — Progress Notes (Signed)
NEED ORDERS IN EPIC FOR 07-03-12 0800 AM PRE OP. THANKS

## 2012-06-30 NOTE — Patient Instructions (Addendum)
20 GIANNY SABINO  06/30/2012   Your procedure is scheduled on: 07-06-2012  Report to [facility] at 0530  AM.  Call this number if you have problems the morning of surgery [phone]   Remember:   Do not eat food or drink liquids :After Midnight.                                  SEE [place] PREPARING FOR SURGERY SHEET   Do not wear jewelry, make-up or nail polish.  Do not wear lotions, powders, or perfumes. You may wear deodorant.   Men may shave face and neck.  Do not bring valuables to the hospital.  Contacts, dentures or bridgework may not be worn into surgery.     Patients discharged the day of surgery will not be allowed to drive home.  Name and phone number of your driver: (wife) Garfield Cornea [phone]     Please read over the following fact sheets that you were given: MRSA Information.  Call  Antero Derosia  RN pre op nurse if needed [phone]    FAILURE TO FOLLOW THESE INSTRUCTIONS MAY RESULT IN THE CANCELLATION OF YOUR SURGERY. PATIENT SIGNATURE___________________________________________

## 2012-07-03 ENCOUNTER — Encounter (HOSPITAL_COMMUNITY): Payer: Self-pay

## 2012-07-03 ENCOUNTER — Ambulatory Visit (HOSPITAL_COMMUNITY)
Admission: RE | Admit: 2012-07-03 | Discharge: 2012-07-03 | Disposition: A | Discharge status: Home / Self Care | Payer: BC Managed Care – PPO | Source: Ambulatory Visit | Attending: Surgery | Admitting: Surgery

## 2012-07-03 ENCOUNTER — Encounter (HOSPITAL_COMMUNITY)
Admission: RE | Admit: 2012-07-03 | Discharge: 2012-07-03 | Disposition: A | Discharge status: Home / Self Care | Payer: BC Managed Care – PPO | Source: Ambulatory Visit | Attending: Surgery | Admitting: Surgery

## 2012-07-03 DIAGNOSIS — K439 Ventral hernia without obstruction or gangrene: Secondary | ICD-10-CM | POA: Insufficient documentation

## 2012-07-03 DIAGNOSIS — M47814 Spondylosis without myelopathy or radiculopathy, thoracic region: Secondary | ICD-10-CM | POA: Insufficient documentation

## 2012-07-03 DIAGNOSIS — Z01818 Encounter for other preprocedural examination: Secondary | ICD-10-CM | POA: Insufficient documentation

## 2012-07-03 DIAGNOSIS — Z01812 Encounter for preprocedural laboratory examination: Secondary | ICD-10-CM | POA: Insufficient documentation

## 2012-07-03 DIAGNOSIS — I10 Essential (primary) hypertension: Secondary | ICD-10-CM | POA: Insufficient documentation

## 2012-07-03 DIAGNOSIS — Z01810 Encounter for preprocedural cardiovascular examination: Secondary | ICD-10-CM | POA: Insufficient documentation

## 2012-07-03 LAB — CBC
HCT: 37.8 % — ABNORMAL LOW (ref 39.0–52.0)
Hemoglobin: 13.3 g/dL (ref 13.0–17.0)
MCV: 91.3 fL (ref 78.0–100.0)
RBC: 4.14 MIL/uL — ABNORMAL LOW (ref 4.22–5.81)
RDW: 11.5 % (ref 11.5–15.5)
WBC: 4.2 10*3/uL (ref 4.0–10.5)

## 2012-07-03 LAB — BASIC METABOLIC PANEL
BUN: 17 mg/dL (ref 6–23)
CO2: 31 mEq/L (ref 19–32)
Calcium: 9.3 mg/dL (ref 8.4–10.5)
GFR calc non Af Amer: 77 mL/min — ABNORMAL LOW (ref >90)
Glucose, Bld: 130 mg/dL — ABNORMAL HIGH (ref 70–99)
Sodium: 141 mEq/L (ref 135–145)

## 2012-07-03 LAB — SURGICAL PCR SCREEN: Staphylococcus aureus: INVALID — AB

## 2012-07-06 ENCOUNTER — Ambulatory Visit (HOSPITAL_COMMUNITY): Payer: BC Managed Care – PPO | Admitting: Registered Nurse

## 2012-07-06 ENCOUNTER — Ambulatory Visit (HOSPITAL_COMMUNITY)
Admission: RE | Admit: 2012-07-06 | Discharge: 2012-07-06 | Disposition: A | Discharge status: Home / Self Care | Payer: BC Managed Care – PPO | Source: Ambulatory Visit | Attending: Surgery | Admitting: Surgery

## 2012-07-06 ENCOUNTER — Encounter (HOSPITAL_COMMUNITY)
Admission: RE | Disposition: A | Discharge status: Home / Self Care | Payer: Self-pay | Source: Ambulatory Visit | Attending: Surgery

## 2012-07-06 ENCOUNTER — Encounter (HOSPITAL_COMMUNITY): Payer: Self-pay | Admitting: Registered Nurse

## 2012-07-06 ENCOUNTER — Encounter (HOSPITAL_COMMUNITY): Payer: Self-pay | Admitting: *Deleted

## 2012-07-06 DIAGNOSIS — K430 Incisional hernia with obstruction, without gangrene: Secondary | ICD-10-CM | POA: Insufficient documentation

## 2012-07-06 DIAGNOSIS — K436 Other and unspecified ventral hernia with obstruction, without gangrene: Secondary | ICD-10-CM

## 2012-07-06 DIAGNOSIS — K429 Umbilical hernia without obstruction or gangrene: Secondary | ICD-10-CM | POA: Insufficient documentation

## 2012-07-06 DIAGNOSIS — I4891 Unspecified atrial fibrillation: Secondary | ICD-10-CM | POA: Insufficient documentation

## 2012-07-06 DIAGNOSIS — G4730 Sleep apnea, unspecified: Secondary | ICD-10-CM | POA: Insufficient documentation

## 2012-07-06 DIAGNOSIS — I10 Essential (primary) hypertension: Secondary | ICD-10-CM | POA: Insufficient documentation

## 2012-07-06 HISTORY — PX: INSERTION OF MESH: SHX5868

## 2012-07-06 HISTORY — PX: VENTRAL HERNIA REPAIR: SHX424

## 2012-07-06 LAB — MRSA CULTURE

## 2012-07-06 SURGERY — REPAIR, HERNIA, VENTRAL, LAPAROSCOPIC
Anesthesia: General | Site: Abdomen | Wound class: Clean

## 2012-07-06 MED ORDER — LACTATED RINGERS IV SOLN: INTRAVENOUS | Status: DC

## 2012-07-06 MED ORDER — KETOROLAC TROMETHAMINE 30 MG/ML IJ SOLN
INTRAMUSCULAR | Status: DC | PRN
  Administered 2012-07-06: 30 mg via INTRAVENOUS

## 2012-07-06 MED ORDER — SUCCINYLCHOLINE CHLORIDE 20 MG/ML IJ SOLN
INTRAMUSCULAR | Status: DC | PRN
  Administered 2012-07-06: 100 mg via INTRAVENOUS

## 2012-07-06 MED ORDER — BUPIVACAINE-EPINEPHRINE 0.25% -1:200000 IJ SOLN
INTRAMUSCULAR | Status: DC | PRN
  Administered 2012-07-06: 90 mL

## 2012-07-06 MED ORDER — CEFAZOLIN SODIUM-DEXTROSE 2-3 GM-% IV SOLR
2.0000 g | INTRAVENOUS | Status: AC
  Administered 2012-07-06: 2 g via INTRAVENOUS

## 2012-07-06 MED ORDER — SUFENTANIL CITRATE 50 MCG/ML IV SOLN
INTRAVENOUS | Status: DC | PRN
  Administered 2012-07-06: 5 ug via INTRAVENOUS
  Administered 2012-07-06: 10 ug via INTRAVENOUS
  Administered 2012-07-06: 5 ug via INTRAVENOUS
  Administered 2012-07-06 (×4): 10 ug via INTRAVENOUS
  Administered 2012-07-06: 5 ug via INTRAVENOUS
  Administered 2012-07-06: 10 ug via INTRAVENOUS
  Administered 2012-07-06: 5 ug via INTRAVENOUS

## 2012-07-06 MED ORDER — ALUM & MAG HYDROXIDE-SIMETH 200-200-20 MG/5ML PO SUSP
30.0000 mL | Freq: Four times a day (QID) | ORAL | Status: DC | PRN
  Filled 2012-07-06: qty 30

## 2012-07-06 MED ORDER — MIDAZOLAM HCL 5 MG/5ML IJ SOLN
INTRAMUSCULAR | Status: DC | PRN
  Administered 2012-07-06: 2 mg via INTRAVENOUS

## 2012-07-06 MED ORDER — ACETAMINOPHEN 325 MG PO TABS: 650.0000 mg | ORAL_TABLET | ORAL | Status: DC | PRN

## 2012-07-06 MED ORDER — NEOSTIGMINE METHYLSULFATE 1 MG/ML IJ SOLN
INTRAMUSCULAR | Status: DC | PRN
  Administered 2012-07-06: 5 mg via INTRAVENOUS

## 2012-07-06 MED ORDER — GLYCOPYRROLATE 0.2 MG/ML IJ SOLN
INTRAMUSCULAR | Status: DC | PRN
  Administered 2012-07-06: .8 mg via INTRAVENOUS

## 2012-07-06 MED ORDER — SODIUM CHLORIDE 0.9 % IJ SOLN: 3.0000 mL | INTRAMUSCULAR | Status: DC | PRN

## 2012-07-06 MED ORDER — ROCURONIUM BROMIDE 100 MG/10ML IV SOLN
INTRAVENOUS | Status: DC | PRN
  Administered 2012-07-06 (×3): 10 mg via INTRAVENOUS
  Administered 2012-07-06: 5 mg via INTRAVENOUS
  Administered 2012-07-06: 45 mg via INTRAVENOUS
  Administered 2012-07-06: 10 mg via INTRAVENOUS

## 2012-07-06 MED ORDER — IBUPROFEN 200 MG PO TABS: 800.0000 mg | ORAL_TABLET | Freq: Four times a day (QID) | ORAL | Status: DC

## 2012-07-06 MED ORDER — SODIUM CHLORIDE 0.9 % IV SOLN: 250.0000 mL | INTRAVENOUS | Status: DC | PRN

## 2012-07-06 MED ORDER — ACETAMINOPHEN 650 MG RE SUPP
650.0000 mg | RECTAL | Status: DC | PRN
  Filled 2012-07-06: qty 1

## 2012-07-06 MED ORDER — BUPIVACAINE 0.25 % ON-Q PUMP DUAL CATH 300 ML
300.0000 mL | INJECTION | Status: DC
  Filled 2012-07-06: qty 300

## 2012-07-06 MED ORDER — MEPERIDINE HCL 50 MG/ML IJ SOLN: 6.2500 mg | INTRAMUSCULAR | Status: DC | PRN

## 2012-07-06 MED ORDER — PROPOFOL 10 MG/ML IV BOLUS
INTRAVENOUS | Status: DC | PRN
  Administered 2012-07-06: 200 mg via INTRAVENOUS

## 2012-07-06 MED ORDER — EPHEDRINE SULFATE 50 MG/ML IJ SOLN
INTRAMUSCULAR | Status: DC | PRN
  Administered 2012-07-06: 10 mg via INTRAVENOUS

## 2012-07-06 MED ORDER — IBUPROFEN 800 MG PO TABS
800.0000 mg | ORAL_TABLET | Freq: Three times a day (TID) | ORAL | Status: DC
  Administered 2012-07-06: 800 mg via ORAL
  Filled 2012-07-06 (×9): qty 1

## 2012-07-06 MED ORDER — LIDOCAINE HCL (CARDIAC) 20 MG/ML IV SOLN
INTRAVENOUS | Status: DC | PRN
  Administered 2012-07-06: 100 mg via INTRAVENOUS

## 2012-07-06 MED ORDER — ACETAMINOPHEN 10 MG/ML IV SOLN
INTRAVENOUS | Status: DC | PRN
  Administered 2012-07-06: 1000 mg via INTRAVENOUS

## 2012-07-06 MED ORDER — OXYCODONE HCL 5 MG PO TABS: 5.0000 mg | ORAL_TABLET | ORAL | Status: DC | PRN

## 2012-07-06 MED ORDER — LACTATED RINGERS IV SOLN
INTRAVENOUS | Status: DC | PRN
  Administered 2012-07-06 (×2): via INTRAVENOUS

## 2012-07-06 MED ORDER — SODIUM CHLORIDE 0.9 % IJ SOLN: 3.0000 mL | Freq: Two times a day (BID) | INTRAMUSCULAR | Status: DC

## 2012-07-06 MED ORDER — DEXAMETHASONE SODIUM PHOSPHATE 10 MG/ML IJ SOLN
INTRAMUSCULAR | Status: DC | PRN
  Administered 2012-07-06: 10 mg via INTRAVENOUS

## 2012-07-06 MED ORDER — CEFAZOLIN SODIUM-DEXTROSE 2-3 GM-% IV SOLR
INTRAVENOUS | Status: AC
  Filled 2012-07-06: qty 50

## 2012-07-06 MED ORDER — HETASTARCH-ELECTROLYTES 6 % IV SOLN
INTRAVENOUS | Status: DC | PRN
  Administered 2012-07-06: 08:00:00 via INTRAVENOUS

## 2012-07-06 MED ORDER — BUPIVACAINE-EPINEPHRINE 0.25% -1:200000 IJ SOLN
INTRAMUSCULAR | Status: AC
  Filled 2012-07-06: qty 1

## 2012-07-06 MED ORDER — ONDANSETRON HCL 4 MG/2ML IJ SOLN
INTRAMUSCULAR | Status: DC | PRN
  Administered 2012-07-06: 4 mg via INTRAVENOUS

## 2012-07-06 MED ORDER — STERILE WATER FOR IRRIGATION IR SOLN
Status: DC | PRN
  Administered 2012-07-06: 1500 mL

## 2012-07-06 MED ORDER — ACETAMINOPHEN 10 MG/ML IV SOLN
INTRAVENOUS | Status: AC
  Filled 2012-07-06: qty 100

## 2012-07-06 MED ORDER — HYDROMORPHONE HCL PF 1 MG/ML IJ SOLN: 0.2500 mg | INTRAMUSCULAR | Status: DC | PRN

## 2012-07-06 MED ORDER — PROMETHAZINE HCL 25 MG/ML IJ SOLN: 6.2500 mg | INTRAMUSCULAR | Status: DC | PRN

## 2012-07-06 MED ORDER — ONDANSETRON HCL 4 MG/2ML IJ SOLN: 4.0000 mg | Freq: Four times a day (QID) | INTRAMUSCULAR | Status: DC | PRN

## 2012-07-06 MED ORDER — PHENYLEPHRINE HCL 10 MG/ML IJ SOLN
INTRAMUSCULAR | Status: DC | PRN
  Administered 2012-07-06: 120 ug via INTRAVENOUS

## 2012-07-06 MED ORDER — BUPIVACAINE HCL 0.25 % IJ SOLN
INTRAMUSCULAR | Status: AC
  Filled 2012-07-06: qty 1

## 2012-07-06 SURGICAL SUPPLY — 47 items
APPLIER CLIP 5 13 M/L LIGAMAX5 (MISCELLANEOUS)
BINDER ABD UNIV 12 45-62 (WOUND CARE) ×1 IMPLANT
BINDER ABDOMINAL 46IN 62IN (WOUND CARE) ×2
CANISTER SUCTION 2500CC (MISCELLANEOUS) IMPLANT
CATH KIT ON-Q SILVERSOAK 7.5IN (CATHETERS) ×4 IMPLANT
CLIP APPLIE 5 13 M/L LIGAMAX5 (MISCELLANEOUS) IMPLANT
CLOTH BEACON ORANGE TIMEOUT ST (SAFETY) ×2 IMPLANT
DECANTER SPIKE VIAL GLASS SM (MISCELLANEOUS) ×2 IMPLANT
DEVICE SECURE STRAP 25 ABSORB (INSTRUMENTS) ×2 IMPLANT
DEVICE TROCAR PUNCTURE CLOSURE (ENDOMECHANICALS) ×2 IMPLANT
DRAPE LAPAROSCOPIC ABDOMINAL (DRAPES) ×2 IMPLANT
DRSG TEGADERM 2-3/8X2-3/4 SM (GAUZE/BANDAGES/DRESSINGS) ×6 IMPLANT
ELECT REM PT RETURN 9FT ADLT (ELECTROSURGICAL) ×2
ELECTRODE REM PT RTRN 9FT ADLT (ELECTROSURGICAL) ×1 IMPLANT
FILTER SMOKE EVAC LAPAROSHD (FILTER) IMPLANT
GAUZE SPONGE 2X2 8PLY STRL LF (GAUZE/BANDAGES/DRESSINGS) ×1 IMPLANT
GLOVE ECLIPSE 8.0 STRL XLNG CF (GLOVE) ×2 IMPLANT
GLOVE INDICATOR 8.0 STRL GRN (GLOVE) ×4 IMPLANT
GOWN STRL NON-REIN LRG LVL3 (GOWN DISPOSABLE) ×4 IMPLANT
GOWN STRL REIN XL XLG (GOWN DISPOSABLE) ×6 IMPLANT
HAND ACTIVATED (MISCELLANEOUS) IMPLANT
KIT BASIN OR (CUSTOM PROCEDURE TRAY) ×2 IMPLANT
MARKER SKIN DUAL TIP RULER LAB (MISCELLANEOUS) ×2 IMPLANT
MESH PHYSIO OVAL 15X20CM (Mesh General) ×2 IMPLANT
NEEDLE SPNL 22GX3.5 QUINCKE BK (NEEDLE) ×2 IMPLANT
NS IRRIG 1000ML POUR BTL (IV SOLUTION) IMPLANT
PENCIL BUTTON HOLSTER BLD 10FT (ELECTRODE) IMPLANT
SCISSORS LAP 5X35 DISP (ENDOMECHANICALS) ×2 IMPLANT
SET IRRIG TUBING LAPAROSCOPIC (IRRIGATION / IRRIGATOR) IMPLANT
SLEEVE XCEL OPT CAN 5 100 (ENDOMECHANICALS) ×2 IMPLANT
SLEEVE Z-THREAD 5X100MM (TROCAR) IMPLANT
SPONGE GAUZE 2X2 STER 10/PKG (GAUZE/BANDAGES/DRESSINGS) ×1
STRIP CLOSURE SKIN 1/2X4 (GAUZE/BANDAGES/DRESSINGS) ×2 IMPLANT
SUT MNCRL AB 4-0 PS2 18 (SUTURE) ×2 IMPLANT
SUT PROLENE 1 CT 1 30 (SUTURE) ×16 IMPLANT
SUT VIC AB 2-0 UR6 27 (SUTURE) IMPLANT
TOWEL OR 17X26 10 PK STRL BLUE (TOWEL DISPOSABLE) ×2 IMPLANT
TOWEL OR NON WOVEN STRL DISP B (DISPOSABLE) ×2 IMPLANT
TRAY FOLEY CATH 14FRSI W/METER (CATHETERS) ×2 IMPLANT
TRAY LAP CHOLE (CUSTOM PROCEDURE TRAY) ×2 IMPLANT
TROCAR XCEL BLADELESS 5X75MML (TROCAR) IMPLANT
TROCAR XCEL NON-BLD 11X100MML (ENDOMECHANICALS) ×2 IMPLANT
TROCAR XCEL NON-BLD 5MMX100MML (ENDOMECHANICALS) ×2 IMPLANT
TROCAR Z-THREAD FIOS 11X100 BL (TROCAR) IMPLANT
TROCAR Z-THREAD FIOS 5X100MM (TROCAR) IMPLANT
TUBING INSUFFLATION 10FT LAP (TUBING) ×2 IMPLANT
TUNNELER SHEATH ON-Q 16GX12 DP (PAIN MANAGEMENT) ×2 IMPLANT

## 2012-07-06 NOTE — Transfer of Care (Signed)
Immediate Anesthesia Transfer of Care Note  Patient: Tony [name]  Procedure(s) Performed: Procedure(s): LAPAROSCOPIC VENTRAL HERNIA (N/A) INSERTION OF MESH (N/A)  Patient Location: PACU  Anesthesia Type:General  Level of Consciousness: awake, alert , oriented and patient cooperative  Airway & Oxygen Therapy: Patient Spontanous Breathing and Patient connected to face mask oxygen  Post-op Assessment: Report given to PACU RN and Post -op Vital signs reviewed and stable  Post vital signs: stable  Complications: No apparent anesthesia complications

## 2012-07-06 NOTE — Anesthesia Preprocedure Evaluation (Addendum)
Anesthesia Evaluation  Patient identified by MRN, date of birth, ID band Patient awake    Reviewed: Allergy & Precautions, H&P , NPO status , Patient's Chart, lab work & pertinent test results  Airway Mallampati: II TM Distance: >3 FB Neck ROM: Full    Dental no notable dental hx.    Pulmonary neg pulmonary ROS, sleep apnea and Continuous Positive Airway Pressure Ventilation ,  breath sounds clear to auscultation  Pulmonary exam normal       Cardiovascular hypertension, Pt. on medications negative cardio ROS  + dysrhythmias Atrial Fibrillation Rhythm:Regular Rate:Normal     Neuro/Psych negative neurological ROS  negative psych ROS   GI/Hepatic negative GI ROS, Neg liver ROS,   Endo/Other  negative endocrine ROS  Renal/GU negative Renal ROS  negative genitourinary   Musculoskeletal negative musculoskeletal ROS (+)   Abdominal   Peds negative pediatric ROS (+)  Hematology negative hematology ROS (+)   Anesthesia Other Findings   Reproductive/Obstetrics negative OB ROS                        Anesthesia Physical Anesthesia Plan  ASA: II  Anesthesia Plan: General   Post-op Pain Management:    Induction: Intravenous  Airway Management Planned: Oral ETT  Additional Equipment:   Intra-op Plan:   Post-operative Plan: Extubation in OR  Informed Consent: I have reviewed the patients History and Physical, chart, labs and discussed the procedure including the risks, benefits and alternatives for the proposed anesthesia with the patient or authorized representative who has indicated his/her understanding and acceptance.   Dental advisory given  Plan Discussed with: CRNA  Anesthesia Plan Comments:         Anesthesia Quick Evaluation

## 2012-07-06 NOTE — Op Note (Signed)
07/06/2012  9:38 AM  PATIENT:  Tony [name]  52 y.o. male  Patient Care Team: Darnelle Bos, MD as PCP - General (Internal Medicine)  PRE-OPERATIVE DIAGNOSIS:  recurrent periumbilical ventral hernia   POST-OPERATIVE DIAGNOSIS:  incarcerated recurrent ventral hernia    PROCEDURE:  Procedure(s): LAPAROSCOPIC VENTRAL HERNIA INSERTION OF MESH  SURGEON:  Surgeon(s): Ardeth Sportsman, MD  ASSISTANT: RN   ANESTHESIA:   local, general and ONQ continuous bupivicaine pain pump  EBL:  Total I/O In: 1000 [I.V.:1000] Out: 250 [Urine:250]  Delay start of Pharmacological VTE agent (>24hrs) due to surgical blood loss or risk of bleeding:  no  DRAINS: OnQ pump in preperitoneal plane   SPECIMEN:  Source of Specimen:  Epiploic appendage (incarcerated in [facility])  DISPOSITION OF SPECIMEN:  PATHOLOGY  COUNTS:  YES  PLAN OF CARE: Discharge to home after PACU  PATIENT DISPOSITION:  PACU - hemodynamically stable.  INDICATION: Obese gentleman required emergent appendectomy.  10 umbilical hernia primarily repaired.  Has developed recurrence.  Has had episodes of severe pain.  Concerning for incarcerated periumbilical ventral hernia.  I recommended surgery:  The anatomy & physiology of the abdominal wall was discussed.  The pathophysiology of hernias was discussed.  Natural history risks without surgery including progeressive enlargement, pain, incarceration & strangulation was discussed.   Contributors to complications such as smoking, obesity, diabetes, prior surgery, etc were discussed.   I feel the risks of no intervention will lead to serious problems that outweigh the operative risks; therefore, I recommended surgery to reduce and repair the hernia.  I explained laparoscopic techniques with possible need for an open approach.  I noted the probable use of mesh to patch and/or buttress the hernia repair  Risks such as bleeding, infection, abscess, need for further treatment, heart attack,  death, and other risks were discussed.  I noted a good likelihood this will help address the problem.   Goals of post-operative recovery were discussed as well.  Possibility that this will not correct all symptoms was explained.  I stressed the importance of low-impact activity, aggressive pain control, avoiding constipation, & not pushing through pain to minimize risk of post-operative chronic pain or injury. Possibility of reherniation especially with smoking, obesity, diabetes, immunosuppression, and other health conditions was discussed.  We will work to minimize complications.     An educational handout further explaining the pathology & treatment options was given as well.  Questions were answered.  The patient expresses understanding & wishes to proceed with surgery.  OR FINDINGS: 3x2.5cm Periumbilical ventral hernia.  Incarcerated with omentum and epiploic appendage of colon.  No evidence of other abnormalities.  DESCRIPTION:   Informed consent was confirmed. The patient underwent general anaesthesia without difficulty. The patient was positioned appropriately. VTE prevention in place. The patient's abdomen was clipped, prepped, & draped in a sterile fashion. Surgical timeout confirmed our plan.  The patient was positioned in reverse Trendelenburg. Abdominal entry was gained using optical entry technique in the left upper abdomen. Entry was clean. I induced carbon dioxide insufflation. Camera inspection revealed no injury. Extra ports were carefully placed under direct laparoscopic visualization.   I could see the hernia in the central  abdomen.   I did laparoscopic lysis of adhesions to expose the entire anterior abdominal wall.  I primarily used focused cautery & cold scissors.    He had an ischemic epiploic appendage up within the incarcerated hernia.  I removed that and sent off for pathology.  The omentum  looked viable so I left alone.  I freed off the prominent preperitoneal fat  periumbilical he is up towards the falciform ligament of the liver edge.  I saw no evidence of any hernias on the anterior abdominal wall.  I made sure hemostasis was good.  I mapped out the region using a needle passer.   To ensure that I would have at least 5 cm radial coverage outside of the hernia defect, I chose a 20x15cm dual sided mesh (Physiomesh).    I placed #1 Prolene stitches around its edge about every 5 cm = 14 total.  I rolled the mesh & placed into the peritoneal cavity through the 10 cm fascial defect.  I unrolled  the mesh and positioned it appropriately.  I secured the mesh to cover up the hernia defect using a laparoscopic suture passer to pass the tails of the Prolene through the abdominal wall & tagged them with clamps.  I started out in four corners to make sure I had the mesh centered over the hernia defect appropriately, and then proceeded to work in quadrants.  We evacuated CO2 & desufflated the abdomen.  I tied the fascial stitches down.  I reinsufflated the abdomen.  The mesh provided at least 5-10 cm circumferential coverage around the entire region of hernia defects.   I tacked the edges & central part of the mesh with  SecureStrap absorbable tacks.   Hemostasis was excellent.  I closed the fascia port site on the 10 mm port using a 0 Vicryl stitch using laparoscopic intracorporeal suture passer. I then placed On-Q catheter sheaths in the preperitoneal plane under direct laparoscopic guidance from the subxiphoid region down to the posterior iliac crests in the lower flanks. I did reinspection. Hemostasis was good. Mesh laid well. Capnoperitoneum was evacuated. Ports were removed. The skin was closed with Monocryl at the port sites and Steri-Strips on the fascial stitch puncture sites. OnQ catheters were placed and the sheathes peeled away. On-Q pump was secured. Patient is being extubated to go to the recovery room. I'm about to discuss operative findings with the patient's family.

## 2012-07-06 NOTE — Progress Notes (Signed)
PACU- Multiple stab wounds around umbilicus intact wit dermabond.

## 2012-07-06 NOTE — H&P (Signed)
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 Underwent emergency appendectomy a few years ago. Had an umbilical hernia that I repaired. It recurred. I saw him last year. I recommend he consider surgery. I thought there was an area supraumbilically as well. Recommended diagnostic laparoscopy to map out area with possible underlying repair.   He tabled having surgery. He had to have a pulmonary vein isolation done at [facility]. He is feeling better. Back to work. Interested in getting surgery done. The area is slightly larger. He is often sensitive and he notices that it is there. Does not have any severe pain with it. Urinating fine. Regular bowel movements every day. Works out in [employer] Wants to make sure it is safe to exercise after surgery.  Had an episode of severe pain.  Concerned him.  Went to the ER.  CT scan noted fat within the hernia.  No bowel.  He felt reassured.  He is more Interested in proceeding with surgery now.  Past Medical History  Diagnosis Date  . Hypertension   . Sleep apnea   . A-fib 2008  . Ventral hernia   . Hemorrhoid thrombosis 2012    self -resolved    Past Surgical History  Procedure Laterality Date  . Ablation saphenous vein w/ rfa  2008  . Hernia repair  1968    RIH  . Hernia repair  2010    Primary umb hernia w appy  . Appendectomy  Jan 2010    lap w Primary UHR  . Cardioversion      History   Social History  . Marital Status: Married    Spouse Name: N/A    Number of Children: N/A  . Years of Education: N/A   Occupational History  . Not on file.   Social History Main Topics  . Smoking status: Never Smoker   . Smokeless tobacco: Never Used  . Alcohol Use: 0.0 oz/week     Comment: occasional  . Drug Use: No  . Sexually Active: Not on file   Other Topics Concern  . Not on file   Social History Narrative  . No narrative on file     Family History  Problem Relation Age of Onset  . Cancer Mother   . Heart disease Mother   . Cancer Father     Current Facility-Administered Medications  Medication Dose Route Frequency Provider Last Rate Last Dose  . ceFAZolin (ANCEF) IVPB 2 g/50 mL premix  2 g Intravenous 60 min Pre-Op Ardeth Sportsman, MD       Facility-Administered Medications Ordered in Other Encounters  Medication Dose Route Frequency Provider Last Rate Last Dose  . lactated ringers infusion    Continuous PRN Lacey A Armistead, CRNA         No Known Allergies  ROS: Constitutional:  No fevers, chills, sweats.  Weight stable Eyes:  No vision changes, No discharge HENT:  No sore throats, nasal drainage Lymph: No neck swelling, No bruising easily Pulmonary:  No cough, productive sputum CV: No orthopnea, PND  Patient walks 30 minutes for about 1 miles without difficulty.  No exertional chest/neck/shoulder/arm pain. GI:  No personal nor family history of GI/colon cancer, inflammatory bowel disease, irritable bowel syndrome, allergy such as Celiac Sprue, dietary/dairy problems, colitis, ulcers nor gastritis.  No recent sick contacts/gastroenteritis.  No travel outside the country.  No changes in  diet. Renal: No UTIs, No hematuria Genital:  No drainage, bleeding, masses Musculoskeletal: No severe joint pain.  Good ROM major joints Skin:  No sores or lesions.  No rashes Heme/Lymph:  No easy bleeding.  No swollen lymph nodes Neuro: No focal weakness/numbness.  No seizures Psych: No suicidal ideation.  No hallucinations  BP 121/78  Pulse 58  Temp(Src) 97 F (36.1 C) (Oral)  Resp 18  SpO2 100%  Physical Exam: General: Pt awake/alert/oriented x4 in no major acute distress Eyes: PERRL, normal EOM. Sclera nonicteric Neuro: CN II-XII intact w/o focal sensory/motor deficits. Lymph: No head/neck/groin lymphadenopathy Psych:  No delerium/psychosis/paranoia HENT: Normocephalic, Mucus membranes moist.  No  thrush Neck: Supple, No tracheal deviation Chest: No pain.  Good respiratory excursion. CV:  Pulses intact.  Regular rhythm Abdomen: Soft, Nondistended.  Hernia umbilical sensitive but OK.  Poss smaller VWH epigastric. Ext:  SCDs BLE.  No significant edema.  No cyanosis Skin: No petechiae / purpurea.  No major sores Musculoskeletal: No severe joint pain.  Good ROM major joints   Results:   Labs: No results found for this or any previous visit (from the past 48 hour(s)).  Imaging / Studies: Dg Chest 2 View  07/03/2012  *RADIOLOGY REPORT*  Clinical Data: Preop for ventral hernia repair.  Hypertension. Nonsmoker.  CHEST - 2 VIEW  Comparison: 10/15/2008  Findings: Lower thoracic spondylosis. Midline trachea.  Normal heart size and mediastinal contours. No pleural effusion or pneumothorax.  Clear lungs.  IMPRESSION: No acute cardiopulmonary disease.   Original Report Authenticated By: Jeronimo Greaves, M.D.    Ct Abdomen Pelvis W Contrast  06/25/2012  *RADIOLOGY REPORT*  Clinical Data: Evaluate for umbilical hernia  CT ABDOMEN AND PELVIS WITH CONTRAST  Technique:  Multidetector CT imaging of the abdomen and pelvis was performed following the standard protocol during bolus administration of intravenous contrast.  Contrast: OMNIPAQUE IOHEXOL 300 MG/ML  SOLN  Comparison: Prior CT abdomen/pelvis 06/11/2008  Findings:  Lower Chest:  Trace dependent atelectasis in the lower lobes.  The lung bases are otherwise clear.  Visualized cardiac structures within normal limits for size.  No pericardial effusion.  The distal esophagus is within normal limits.  Abdomen: Unremarkable CT appearance of the stomach, duodenum, spleen, adrenal glands, pancreas, and liver. Gallbladder is unremarkable. No intra or extrahepatic biliary ductal dilatation.  Normal appearance of the kidneys.  No nephrolithiasis or hydronephrosis.  No solid renal mass.  Normal-caliber large and small bowel throughout the abdomen. Surgical staple  line at the base of the cecum consistent with prior appendectomy.  No significant colonic diverticular disease.  Small omental fat containing umbilical hernia just left of midline at the umbilicus.  Pelvis: Unremarkable appearance of the seminal vesicles and bladder.  Mild prostatomegaly.  The prostate measures 5.1 cm in transverse dimension.  No free fluid or suspicious adenopathy.  Bones: No acute fracture or aggressive appearing lytic or blastic osseous lesion.  Vascular: Trace scattered atherosclerotic vascular calcifications without aneurysmal dilatation or evidence of significant stenosis.  IMPRESSION:  1.  Positive for a small left periumbilical hernia containing omental fat.  2.  Surgical changes of prior appendectomy.   Original Report Authenticated By: Malachy Moan, M.D.     Medications / Allergies: per chart  Antibiotics: Anti-infectives   Start     Dose/Rate Route Frequency Ordered Stop   07/06/12 0535  ceFAZolin (ANCEF) IVPB 2 g/50 mL premix     2 g 100 mL/hr over 30 Minutes Intravenous 60 min pre-op 07/06/12  8841        Assessment  Tony [name]  52 y.o. male  Day of Surgery  Procedure(s): LAPAROSCOPIC VENTRAL HERNIA INSERTION OF MESH  Problem List:  Active Problems:   * No active hospital problems. *  Recurrent periumbilical ventral hernia with probable small epigastric ventral hernia. Slightly larger.   Plan:   Given his physical activity and enlarging size, I recommended surgery to repair the hernias. Would start out laparoscopically to make sure the area is well mouth out. Hopefully just an outpatient procedure. I did caution him that he will be out a couple of weeks with surgery. This is not a quick pain-free procedure.   The anatomy & physiology of the abdominal wall was discussed. The pathophysiology of hernias was discussed. Natural history risks without surgery including progeressive enlargement, pain, incarceration & strangulation was discussed. Contributors  to complications such as smoking, obesity, diabetes, prior surgery, etc were discussed.  I feel the risks of no intervention will lead to serious problems that outweigh the operative risks; therefore, I recommended surgery to reduce and repair the hernia. I explained laparoscopic techniques with possible need for an open approach. I noted the probable use of mesh to patch and/or buttress the hernia repair  Risks such as bleeding, infection, abscess, need for further treatment, heart attack, death, and other risks were discussed. I noted a good likelihood this will help address the problem. Goals of post-operative recovery were discussed as well. Possibility that this will not correct all symptoms was explained. I stressed the importance of low-impact activity, aggressive pain control, avoiding constipation, & not pushing through pain to minimize risk of post-operative chronic pain or injury. Possibility of reherniation especially with smoking, obesity, diabetes, immunosuppression, and other health conditions was discussed. We will work to minimize complications.  An educational handout further explaining the pathology & treatment options has been given as well. Questions were answered. The patient expresses understanding & wishes to proceed with surgery.     Ardeth Sportsman, M.D., F.A.C.S. Gastrointestinal and Minimally Invasive Surgery [facility] [address], [address] [place], [place] [address] [phone] Main / Paging [phone] Voice Mail   07/06/2012  CARE TEAM:  PCP: Darnelle Bos, MD  Outpatient Care Team: Patient Care Team: Darnelle Bos, MD as PCP - General (Internal Medicine)  Inpatient Treatment Team: Treatment Team: Attending Provider: Ardeth Sportsman, MD

## 2012-07-06 NOTE — Anesthesia Postprocedure Evaluation (Signed)
  Anesthesia Post-op Note  Patient: Tony [name]  Procedure(s) Performed: Procedure(s) (LRB): LAPAROSCOPIC VENTRAL HERNIA (N/A) INSERTION OF MESH (N/A)  Patient Location: PACU  Anesthesia Type: General  Level of Consciousness: awake and alert   Airway and Oxygen Therapy: Patient Spontanous Breathing  Post-op Pain: mild  Post-op Assessment: Post-op Vital signs reviewed, Patient's Cardiovascular Status Stable, Respiratory Function Stable, Patent Airway and No signs of Nausea or vomiting  Last Vitals:  Filed Vitals:   07/06/12 1041  BP: 126/69  Pulse: 65  Temp: 35.9 C  Resp:     Post-op Vital Signs: stable   Complications: No apparent anesthesia complications

## 2012-07-07 ENCOUNTER — Telehealth (INDEPENDENT_AMBULATORY_CARE_PROVIDER_SITE_OTHER): Payer: Self-pay | Admitting: *Deleted

## 2012-07-07 ENCOUNTER — Encounter (HOSPITAL_COMMUNITY): Payer: Self-pay | Admitting: Surgery

## 2012-07-07 ENCOUNTER — Telehealth (INDEPENDENT_AMBULATORY_CARE_PROVIDER_SITE_OTHER): Payer: Self-pay | Admitting: General Surgery

## 2012-07-07 NOTE — Telephone Encounter (Signed)
Patient called on call MD Nat Christen MD) last night due to active bleeding concern.  This RN called to speak to patient this morning and he stated that bleeding is controlled.  Patient doesn't feel the need to come in to be seen today since wound no longer bleeding.

## 2012-07-07 NOTE — Telephone Encounter (Signed)
Spoke with patient appt  Po f/u is 07/19/12 at 1130 Am

## 2012-07-19 ENCOUNTER — Encounter (INDEPENDENT_AMBULATORY_CARE_PROVIDER_SITE_OTHER): Payer: BC Managed Care – PPO | Admitting: Surgery

## 2012-07-25 ENCOUNTER — Ambulatory Visit (INDEPENDENT_AMBULATORY_CARE_PROVIDER_SITE_OTHER): Payer: BC Managed Care – PPO | Admitting: Surgery

## 2012-07-25 ENCOUNTER — Encounter (INDEPENDENT_AMBULATORY_CARE_PROVIDER_SITE_OTHER): Payer: Self-pay | Admitting: Surgery

## 2012-07-25 VITALS — BP 124/74 | HR 78 | Resp 18 | Ht 76.0 in | Wt 238.0 lb

## 2012-07-25 DIAGNOSIS — K429 Umbilical hernia without obstruction or gangrene: Secondary | ICD-10-CM

## 2012-07-25 NOTE — Progress Notes (Signed)
Subjective:     Patient ID: Tony [name], male   DOB: 10/22/1960, 52 y.o.   MRN: [id number]  HPI  Tony [name]  08/15/1960 [id number]  Patient Care Team: Darnelle Bos, MD as PCP - General (Internal Medicine)  This patient is a 52 y.o.male who presents today for surgical evaluation   POST-OPERATIVE DIAGNOSIS: incarcerated recurrent ventral hernia  PROCEDURE: Procedure(s):  LAPAROSCOPIC VENTRAL HERNIA  INSERTION OF MESH   The patient comes in today feeling well.  Removed the On-Q pain pump at home.  Did not use narcotics much.  Just anti-inflammatories.  Getting more active.  Some pain with sneezing and left upper quadrant pulling with more aggressive activity but overall feels like he is doing well.  Metamucil helped avoid constipation - he was thankful for that advice.  No fevers or chills.  Back to work already.  In good spirits.  Patient Active Problem List  Diagnosis  (none) - all problems resolved or deleted    Past Medical History  Diagnosis Date  . Hypertension   . Sleep apnea   . A-fib 2008  . Ventral hernia   . Hemorrhoid thrombosis 2012    self -resolved  . Recurrent ventral periumbilical incisional hernia s/p repair 07/06/2012 03/16/2011    Past Surgical History  Procedure Laterality Date  . Ablation saphenous vein w/ rfa  2008  . Hernia repair  1968    RIH  . Hernia repair  2010    Primary umb hernia w appy  . Appendectomy  Jan 2010    lap w Primary UHR  . Cardioversion    . Ventral hernia repair N/A 07/06/2012    Procedure: LAPAROSCOPIC VENTRAL HERNIA;  Surgeon: Ardeth Sportsman, MD;  Location: [facility];  Service: General;  Laterality: N/A;  . Insertion of mesh N/A 07/06/2012    Procedure: INSERTION OF MESH;  Surgeon: Ardeth Sportsman, MD;  Location: [facility];  Service: General;  Laterality: N/A;    History   Social History  . Marital Status: Married    Spouse Name: N/A    Number of Children: N/A  . Years of Education: N/A   Occupational History   . Not on file.   Social History Main Topics  . Smoking status: Never Smoker   . Smokeless tobacco: Never Used  . Alcohol Use: 0.0 oz/week     Comment: occasional  . Drug Use: No  . Sexually Active: Not on file   Other Topics Concern  . Not on file   Social History Narrative  . No narrative on file    Family History  Problem Relation Age of Onset  . Cancer Mother   . Heart disease Mother   . Cancer Father     Current Outpatient Prescriptions  Medication Sig Dispense Refill  . B Complex-C (B-COMPLEX WITH VITAMIN C) tablet Take 1 tablet by mouth daily.      . cholecalciferol (VITAMIN D) 1000 UNITS tablet Take 1,000 Units by mouth daily.       Marland Kitchen ibuprofen (ADVIL,MOTRIN) 200 MG tablet Take 4 tablets (800 mg total) by mouth every 6 (six) hours. For pain  60 tablet  2  . lisinopril (PRINIVIL,ZESTRIL) 10 MG tablet Take 10 mg by mouth every morning.       Marland Kitchen oxyCODONE (OXY IR/ROXICODONE) 5 MG immediate release tablet Take 1-2 tablets (5-10 mg total) by mouth every 4 (four) hours as needed for pain.  50 tablet  0   No current  facility-administered medications for this visit.     No Known Allergies  BP 124/74  Pulse 78  Resp 18  Ht 6\' 4"  (1.93 m)  Wt 238 lb (107.956 kg)  BMI 28.98 kg/m2  Dg Chest 2 View  07/03/2012  *RADIOLOGY REPORT*  Clinical Data: Preop for ventral hernia repair.  Hypertension. Nonsmoker.  CHEST - 2 VIEW  Comparison: 10/15/2008  Findings: Lower thoracic spondylosis. Midline trachea.  Normal heart size and mediastinal contours. No pleural effusion or pneumothorax.  Clear lungs.  IMPRESSION: No acute cardiopulmonary disease.   Original Report Authenticated By: Jeronimo Greaves, M.D.    Ct Abdomen Pelvis W Contrast  06/25/2012  *RADIOLOGY REPORT*  Clinical Data: Evaluate for umbilical hernia  CT ABDOMEN AND PELVIS WITH CONTRAST  Technique:  Multidetector CT imaging of the abdomen and pelvis was performed following the standard protocol during bolus administration of  intravenous contrast.  Contrast: OMNIPAQUE IOHEXOL 300 MG/ML  SOLN  Comparison: Prior CT abdomen/pelvis 06/11/2008  Findings:  Lower Chest:  Trace dependent atelectasis in the lower lobes.  The lung bases are otherwise clear.  Visualized cardiac structures within normal limits for size.  No pericardial effusion.  The distal esophagus is within normal limits.  Abdomen: Unremarkable CT appearance of the stomach, duodenum, spleen, adrenal glands, pancreas, and liver. Gallbladder is unremarkable. No intra or extrahepatic biliary ductal dilatation.  Normal appearance of the kidneys.  No nephrolithiasis or hydronephrosis.  No solid renal mass.  Normal-caliber large and small bowel throughout the abdomen. Surgical staple line at the base of the cecum consistent with prior appendectomy.  No significant colonic diverticular disease.  Small omental fat containing umbilical hernia just left of midline at the umbilicus.  Pelvis: Unremarkable appearance of the seminal vesicles and bladder.  Mild prostatomegaly.  The prostate measures 5.1 cm in transverse dimension.  No free fluid or suspicious adenopathy.  Bones: No acute fracture or aggressive appearing lytic or blastic osseous lesion.  Vascular: Trace scattered atherosclerotic vascular calcifications without aneurysmal dilatation or evidence of significant stenosis.  IMPRESSION:  1.  Positive for a small left periumbilical hernia containing omental fat.  2.  Surgical changes of prior appendectomy.   Original Report Authenticated By: Malachy Moan, M.D.      Review of Systems  Constitutional: Negative for fever, chills and diaphoresis.  HENT: Negative for sore throat, trouble swallowing and neck pain.   Eyes: Negative for photophobia and visual disturbance.  Respiratory: Negative for choking and shortness of breath.   Cardiovascular: Negative for chest pain and palpitations.  Gastrointestinal: Negative for nausea, vomiting, abdominal distention, anal bleeding  and rectal pain.  Genitourinary: Negative for dysuria, urgency, difficulty urinating and testicular pain.  Musculoskeletal: Negative for myalgias, arthralgias and gait problem.  Skin: Negative for color change and rash.  Neurological: Negative for dizziness, speech difficulty, weakness and numbness.  Hematological: Negative for adenopathy.  Psychiatric/Behavioral: Negative for hallucinations, confusion and agitation.       Objective:   Physical Exam  Constitutional: He is oriented to person, place, and time. He appears well-developed and well-nourished. No distress.  HENT:  Head: Normocephalic.  Mouth/Throat: Oropharynx is clear and moist. No oropharyngeal exudate.  Eyes: Conjunctivae and EOM are normal. Pupils are equal, round, and reactive to light. No scleral icterus.  Neck: Normal range of motion. No tracheal deviation present.  Cardiovascular: Normal rate, normal heart sounds and intact distal pulses.   Pulmonary/Chest: Effort normal. No respiratory distress.  Abdominal: Soft. He exhibits no distension. There is  no tenderness. Hernia confirmed negative in the right inguinal area and confirmed negative in the left inguinal area.  Incisions clean with normal healing ridges.  No hernias  Musculoskeletal: Normal range of motion. He exhibits no tenderness.  Neurological: He is alert and oriented to person, place, and time. No cranial nerve deficit. He exhibits normal muscle tone. Coordination normal.  Skin: Skin is warm and dry. No rash noted. He is not diaphoretic.  Psychiatric: He has a normal mood and affect. His behavior is normal.       Assessment:     Recovering remarkably well status post laparoscopic ventral hernia repair.      Plan:     I am glad he is recovered so remarkably well.  He differently feels the anti-inflammatories and the On-Q pump made a difference.  I recommend he increase to unrestricted activity over this next month.  Some episodes of pulling and pain is  expected but do not push through pain.  Increase activity as tolerated to regular activity.  Do not push through pain.  Diet as tolerated. Bowel regimen to avoid problems.  Return to clinic p.r.n.   Instructions discussed.  Followup with primary care physician for other health issues as would normally be done.  Questions answered.  The patient expressed understanding and appreciation

## 2012-07-25 NOTE — Patient Instructions (Addendum)
HERNIA REPAIR: POST OP INSTRUCTIONS  1. DIET: Follow a light bland diet the first 24 hours after arrival home, such as soup, liquids, crackers, etc.  Be sure to include lots of fluids daily.  Avoid fast food or heavy meals as your are more likely to get nauseated.  Eat a low fat the next few days after surgery. 2. Take your usually prescribed home medications unless otherwise directed. 3. PAIN CONTROL: a. Pain is best controlled by a usual combination of three different methods TOGETHER: i. Ice/Heat ii. Over the counter pain medication iii. Prescription pain medication b. Most patients will experience some swelling and bruising around the hernia(s) such as the bellybutton, groins, or old incisions.  Ice packs or heating pads (30-60 minutes up to 6 times a day) will help. Use ice for the first few days to help decrease swelling and bruising, then switch to heat to help relax tight/sore spots and speed recovery.  Some people prefer to use ice alone, heat alone, alternating between ice & heat.  Experiment to what works for you.  Swelling and bruising can take several weeks to resolve.   c. It is helpful to take an over-the-counter pain medication regularly for the first few weeks.  Choose one of the following that works best for you: i. Naproxen (Aleve, etc)  Two 220mg tabs twice a day ii. Ibuprofen (Advil, etc) Three 200mg tabs four times a day (every meal & bedtime) iii. Acetaminophen (Tylenol, etc) 325-650mg four times a day (every meal & bedtime) d. A  prescription for pain medication should be given to you upon discharge.  Take your pain medication as prescribed.  i. If you are having problems/concerns with the prescription medicine (does not control pain, nausea, vomiting, rash, itching, etc), please call us [phone] to see if we need to switch you to a different pain medicine that will work better for you and/or control your side effect better. ii. If you need a refill on your pain  medication, please contact your pharmacy.  They will contact our office to request authorization. Prescriptions will not be filled after 5 pm or on week-ends. 4. Avoid getting constipated.  Between the surgery and the pain medications, it is common to experience some constipation.  Increasing fluid intake and taking a fiber supplement (such as Metamucil, Citrucel, FiberCon, MiraLax, etc) 1-2 times a day regularly will usually help prevent this problem from occurring.  A mild laxative (prune juice, Milk of Magnesia, MiraLax, etc) should be taken according to package directions if there are no bowel movements after 48 hours.   5. Wash / shower every day.  You may shower over the dressings as they are waterproof.   6. Remove your waterproof bandages 5 days after surgery.  You may leave the incision open to air.  You may replace a dressing/Band-Aid to cover the incision for comfort if you wish.  Continue to shower over incision(s) after the dressing is off.    7. ACTIVITIES as tolerated:   a. You may resume regular (light) daily activities beginning the next day-such as daily self-care, walking, climbing stairs-gradually increasing activities as tolerated.  If you can walk 30 minutes without difficulty, it is safe to try more intense activity such as jogging, treadmill, bicycling, low-impact aerobics, swimming, etc. b. Save the most intensive and strenuous activity for last such as sit-ups, heavy lifting, contact sports, etc  Refrain from any heavy lifting or straining until you are off narcotics for pain control.     c. DO NOT PUSH THROUGH PAIN.  Let pain be your guide: If it hurts to do something, don't do it.  Pain is your body warning you to avoid that activity for another week until the pain goes down. d. You may drive when you are no longer taking prescription pain medication, you can comfortably wear a seatbelt, and you can safely maneuver your car and apply brakes. e. You may have sexual intercourse  when it is comfortable.  8. FOLLOW UP in our office a. Please call [facility] at [phone] to set up an appointment to see your surgeon in the office for a follow-up appointment approximately 2-3 weeks after your surgery. b. Make sure that you call for this appointment the day you arrive home to insure a convenient appointment time. 9.  IF YOU HAVE DISABILITY OR FAMILY LEAVE FORMS, BRING THEM TO THE OFFICE FOR PROCESSING.  DO NOT GIVE THEM TO YOUR DOCTOR.  WHEN TO CALL US [phone]: 1. Poor pain control 2. Reactions / problems with new medications (rash/itching, nausea, etc)  3. Fever over 101.5 F (38.5 C) 4. Inability to urinate 5. Nausea and/or vomiting 6. Worsening swelling or bruising 7. Continued bleeding from incision. 8. Increased pain, redness, or drainage from the incision   The clinic staff is available to answer your questions during regular business hours (8:30am-5pm).  Please don't hesitate to call and ask to speak to one of our nurses for clinical concerns.   If you have a medical emergency, go to the nearest emergency room or call 911.  A surgeon from [facility] is always on call at the hospitals in [place]  [facility] [address], [address], [place], [place]  [address] ?  [address] [address], [place], [place]   [address] MAIN: [phone] ? TOLL FREE: [phone] ? FAX: [phone] [email or web address]  Managing Pain  Pain after surgery or related to activity is often due to strain/injury to muscle, tendon, nerves and/or incisions.  This pain is usually short-term and will improve in a few months.   Many people find it helpful to do the following things TOGETHER to help speed the process of healing and to get back to regular activity more quickly:  1. Avoid heavy physical activity a.  no lifting greater than 20 pounds b. Do not "push through" the pain.  Listen to your body and avoid positions and maneuvers than  reproduce the pain c. Walking is okay as tolerated, but go slowly and stop when getting sore.  d. Remember: If it hurts to do it, then don't do it! 2. Take Anti-inflammatory medication  a. Take with food/snack around the clock for 1-2 weeks i. This helps the muscle and nerve tissues become less irritable and calm down faster b. Choose ONE of the following over-the-counter medications: i. Naproxen 220mg tabs (ex. Aleve) 1-2 pills twice a day  ii. Ibuprofen 200mg tabs (ex. Advil, Motrin) 3-4 pills with every meal and just before bedtime iii. Acetaminophen 500mg tabs (Tylenol) 1-2 pills with every meal and just before bedtime 3. Use a Heating pad or Ice/Cold Pack a. 4-6 times a day b. May use warm bath/hottub  or showers 4. Try Gentle Massage and/or Stretching  a. at the area of pain many times a day b. stop if you feel pain - do not overdo it  Try these steps together to help you body heal faster and avoid making things get worse.  Doing just one of these   things may not be enough.    If you are not getting better after two weeks or are noticing you are getting worse, contact our office for further advice; we may need to re-evaluate you & see what other things we can do to help.   

## 2012-12-06 ENCOUNTER — Telehealth (INDEPENDENT_AMBULATORY_CARE_PROVIDER_SITE_OTHER): Payer: Self-pay

## 2012-12-06 NOTE — Telephone Encounter (Signed)
Pt calling to notify Dr Michaell Cowing that he has been experiencing what he calls a catch on his left side abdomen of his navel that the pt has been having for about 4wks. The pt has not been doing anything for the discomfort. I advised pt that Dr Michaell Cowing would recommend the pt to use an antinflammatory for 2wks advising Advil or Ibuprofen 4 tabs every 4hrs up to 16 tabs within 24hrs along with using heat on the area.The pt is moving his bowels daily with no problems. The pt is eating ok with no problems. The pt describes feeling this catch only when he moves a certain way like for an example from lying in bed then moves to sit up he then will feel the catch. I advised pt that I would speak to Dr Michaell Cowing next week and call him back once I have spoke to Dr Michaell Cowing.

## 2012-12-13 NOTE — Telephone Encounter (Signed)
Pt returned my call. The pt reports that he is doing much better since he started the Ibuprofen one week a go. The pt reports that the pain has gone down a lot so I advised pt to keep taking the Ibuprofen for one more week he should stop the Ibuprofen on Wednesday 8/6. I advised pt that if he continues to be better than he will not need an appt with Dr Michaell Cowing but if the pain comes back he is to call our office to make an appt with Dr Michaell Cowing. The pt understands.

## 2012-12-13 NOTE — Telephone Encounter (Signed)
LMOM for pt to call me so I can discuss the below message with him per Dr Michaell Cowing.

## 2013-03-22 ENCOUNTER — Other Ambulatory Visit: Payer: Self-pay

## 2014-06-11 IMAGING — CT CT ABD-PELV W/ CM
2 of 5 series · 14 of 36 positions shown, 19 images · IV contrast (water/omni  & 100ml omni 300)
Comparison: Prior CT abdomen/pelvis 06/11/2008

CLINICAL DATA: Evaluate for umbilical hernia

CT ABDOMEN AND PELVIS WITH CONTRAST
TECHNIQUE: Multidetector CT imaging of the abdomen and pelvis was
performed following the standard protocol during bolus
administration of intravenous contrast.
Contrast: 100mL OMNIPAQUE IOHEXOL 300 MG/ML  SOLN

[Series 2: routine abdomen · axial · 0.85mm/px · z∈[-469,-54]mm · 13 of 93 slices shown, 17 images]
[im 5/93  soft-tissue]
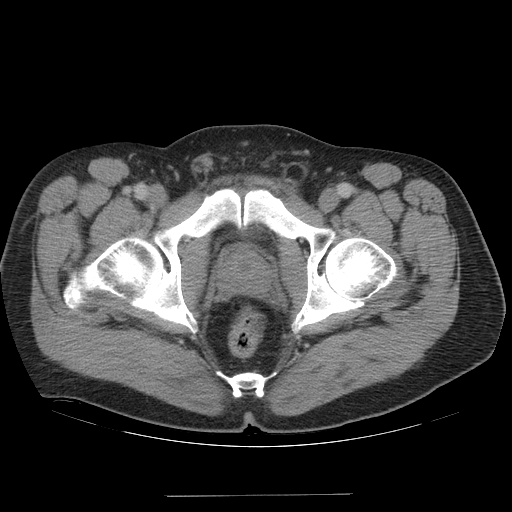
[im 5/93  bone]
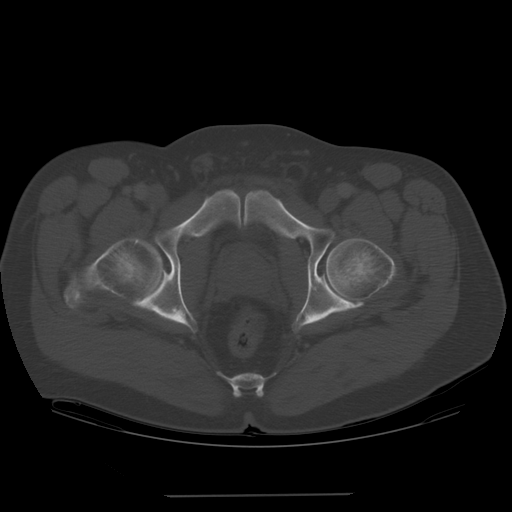
[im 15/93  soft-tissue]
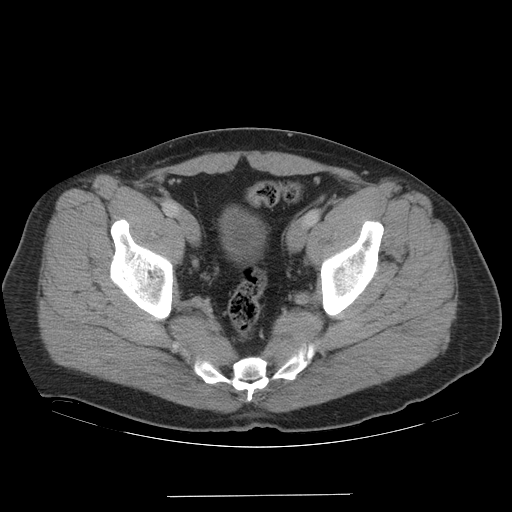
[im 25/93  soft-tissue]
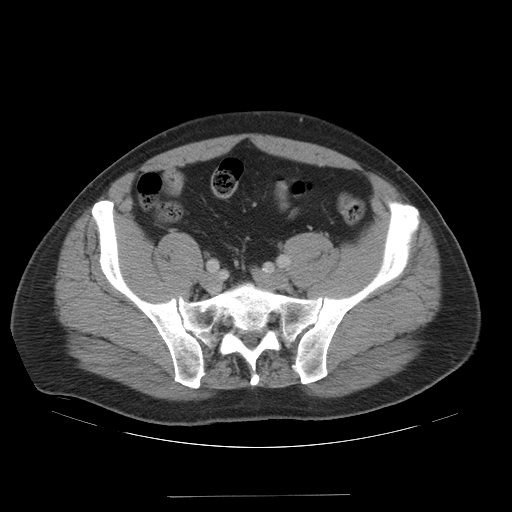
[im 30/93  soft-tissue]
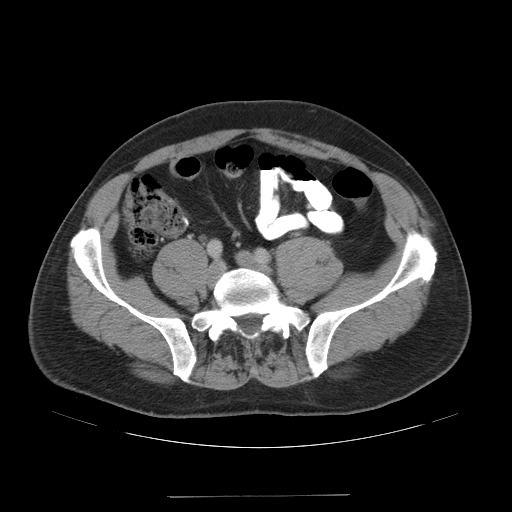
[im 39/93  soft-tissue]
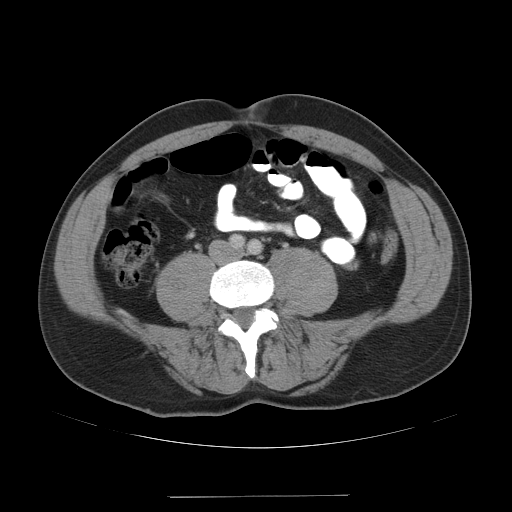
[im 49/93  soft-tissue]
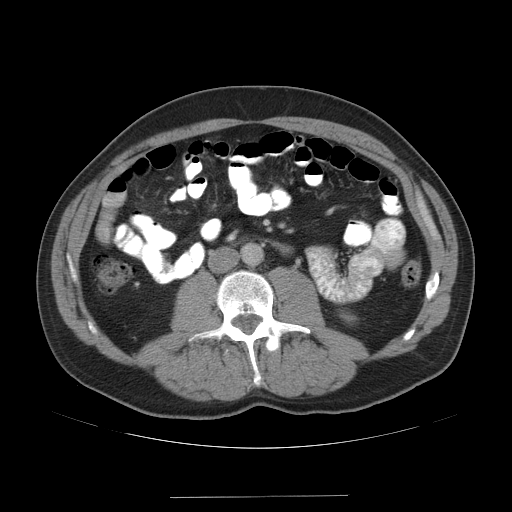
[im 54/93  soft-tissue]
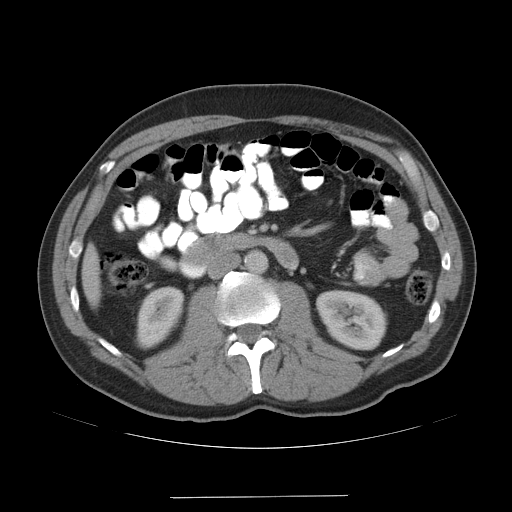
[im 63/93  soft-tissue]
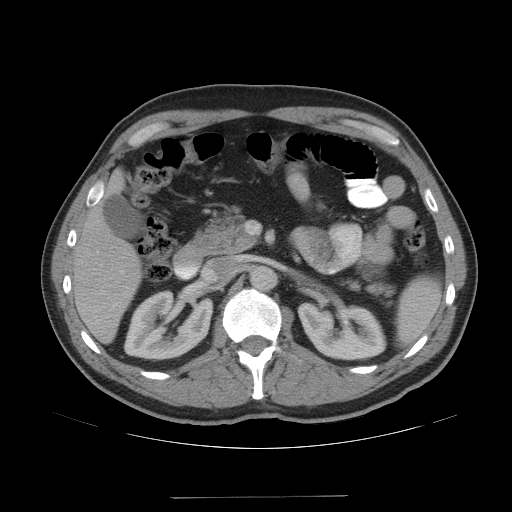
[im 68/93  soft-tissue]
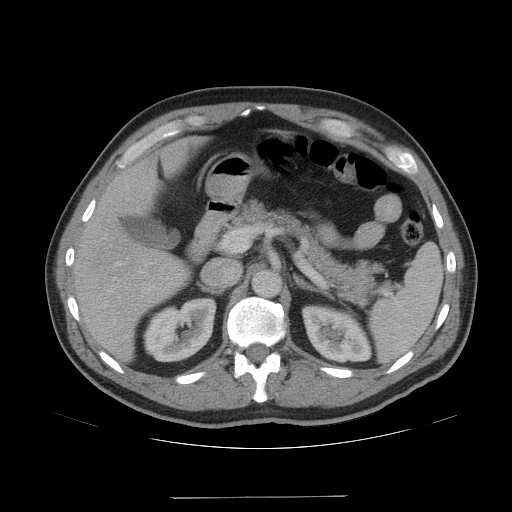
[im 68/93  bone]
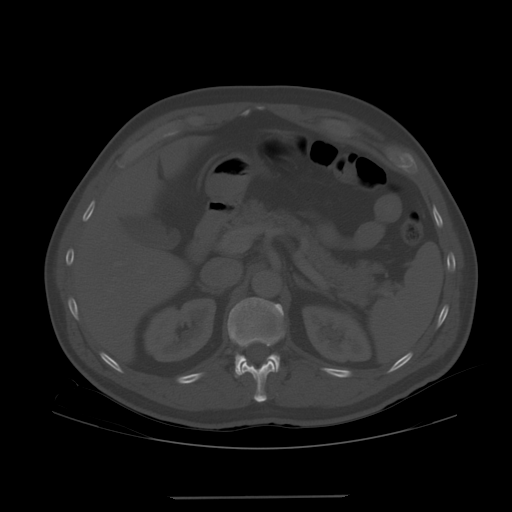
[im 73/93  lung]
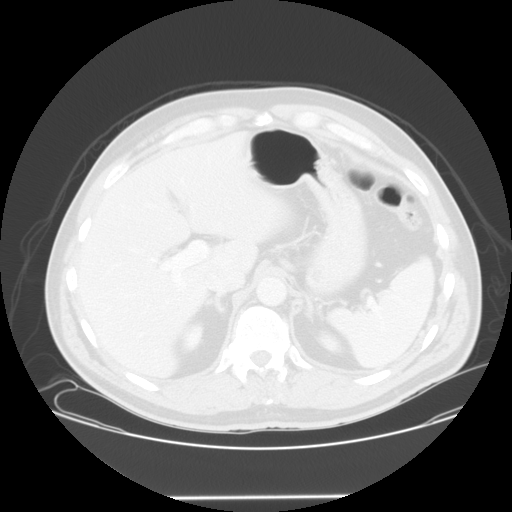
[im 78/93  soft-tissue]
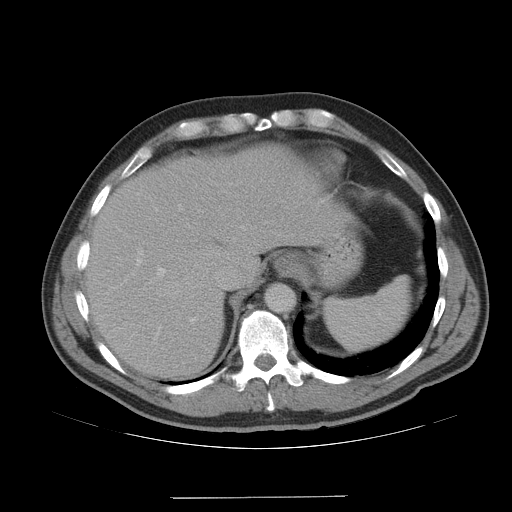
[im 78/93  lung]
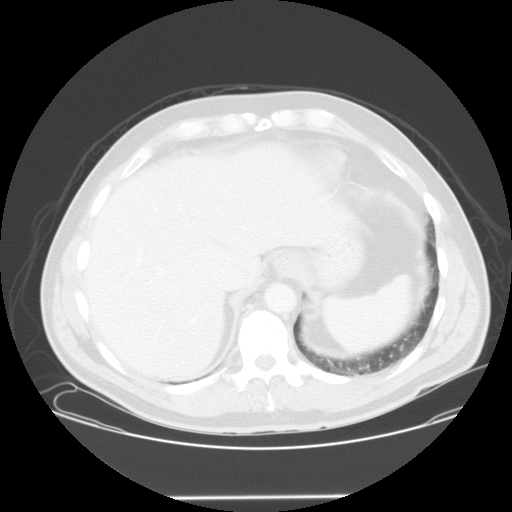
[im 83/93  lung]
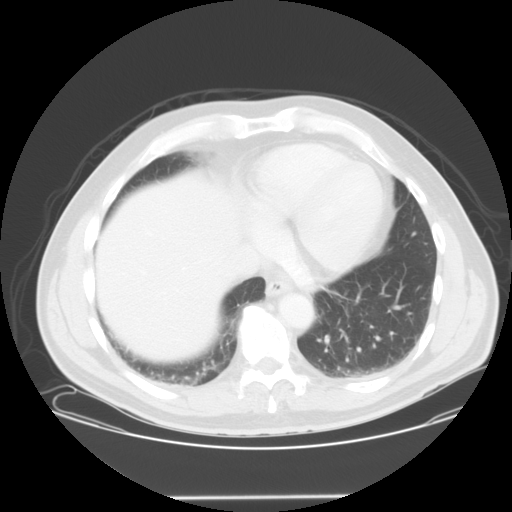
[im 88/93  soft-tissue]
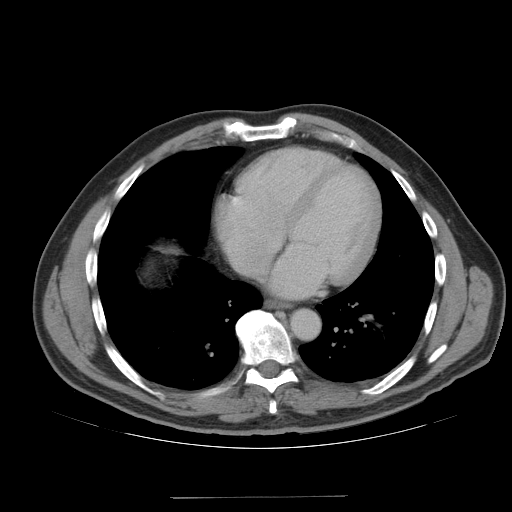
[im 88/93  lung]
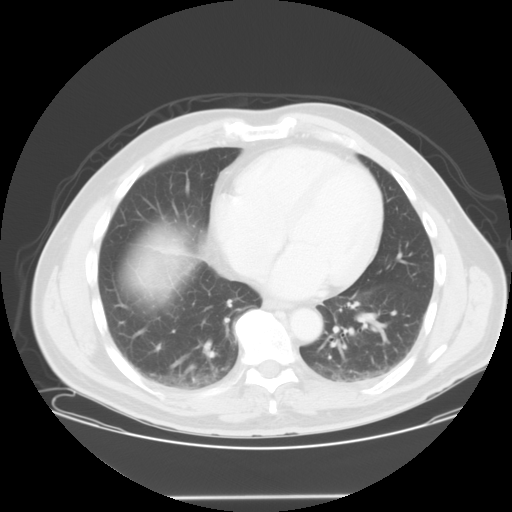

[Series 400: sagittal · sagittal · 1.04mm/px · 1 of 128 slices shown, 2 images]
[im 37/128  soft-tissue]
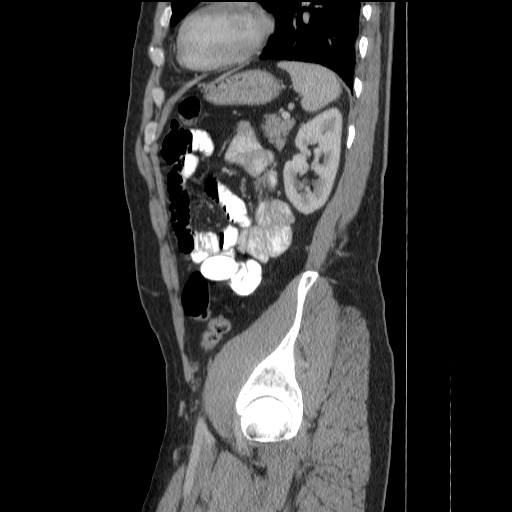
[im 37/128  bone]
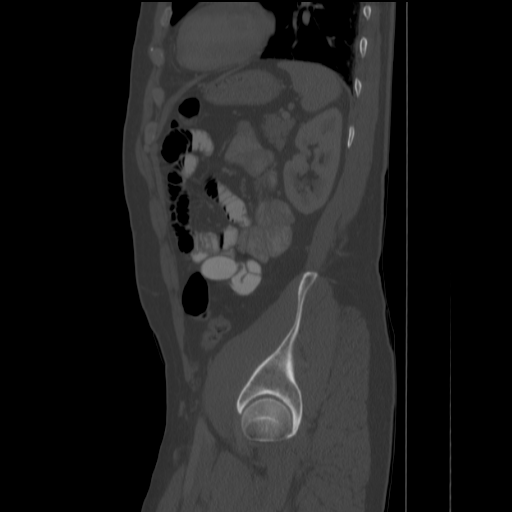

[14 of 36 positions shown; findings below may reference images not displayed]

FINDINGS: Lower Chest:  Trace dependent atelectasis in the lower lobes.  The
lung bases are otherwise clear.  Visualized cardiac structures
within normal limits for size.  No pericardial effusion.  The
distal esophagus is within normal limits.

Abdomen: Unremarkable CT appearance of the stomach, duodenum,
spleen, adrenal glands, pancreas, and liver. Gallbladder is
unremarkable. No intra or extrahepatic biliary ductal dilatation.

Normal appearance of the kidneys.  No nephrolithiasis or
hydronephrosis.  No solid renal mass.

Normal-caliber large and small bowel throughout the abdomen.
Surgical staple line at the base of the cecum consistent with prior
appendectomy.  No significant colonic diverticular disease.  Small
omental fat containing umbilical hernia just left of midline at the
umbilicus.

Pelvis: Unremarkable appearance of the seminal vesicles and
bladder.  Mild prostatomegaly.  The prostate measures 5.1 cm in
transverse dimension.  No free fluid or suspicious adenopathy.

Bones: No acute fracture or aggressive appearing lytic or blastic
osseous lesion.

Vascular: Trace scattered atherosclerotic vascular calcifications
without aneurysmal dilatation or evidence of significant stenosis.
IMPRESSION: 1.  Positive for a small left periumbilical hernia containing
omental fat.

2.  Surgical changes of prior appendectomy.

## 2015-04-23 ENCOUNTER — Other Ambulatory Visit: Payer: Self-pay | Admitting: Gastroenterology

## 2015-07-31 ENCOUNTER — Encounter (HOSPITAL_COMMUNITY): Payer: Self-pay | Admitting: *Deleted

## 2015-08-04 ENCOUNTER — Ambulatory Visit (HOSPITAL_COMMUNITY): Payer: BLUE CROSS/BLUE SHIELD | Admitting: Anesthesiology

## 2015-08-04 ENCOUNTER — Encounter (HOSPITAL_COMMUNITY): Payer: Self-pay | Admitting: Anesthesiology

## 2015-08-04 ENCOUNTER — Encounter (HOSPITAL_COMMUNITY)
Admission: RE | Disposition: A | Discharge status: Home / Self Care | Payer: Self-pay | Source: Ambulatory Visit | Attending: Gastroenterology

## 2015-08-04 ENCOUNTER — Ambulatory Visit (HOSPITAL_COMMUNITY)
Admission: RE | Admit: 2015-08-04 | Discharge: 2015-08-04 | Disposition: A | Discharge status: Home / Self Care | Payer: BLUE CROSS/BLUE SHIELD | Source: Ambulatory Visit | Attending: Gastroenterology | Admitting: Gastroenterology

## 2015-08-04 DIAGNOSIS — I10 Essential (primary) hypertension: Secondary | ICD-10-CM | POA: Insufficient documentation

## 2015-08-04 DIAGNOSIS — K921 Melena: Secondary | ICD-10-CM | POA: Insufficient documentation

## 2015-08-04 DIAGNOSIS — D128 Benign neoplasm of rectum: Secondary | ICD-10-CM | POA: Diagnosis not present

## 2015-08-04 DIAGNOSIS — G4733 Obstructive sleep apnea (adult) (pediatric): Secondary | ICD-10-CM | POA: Diagnosis not present

## 2015-08-04 HISTORY — PX: COLONOSCOPY WITH PROPOFOL: SHX5780

## 2015-08-04 SURGERY — COLONOSCOPY WITH PROPOFOL
Anesthesia: Monitor Anesthesia Care

## 2015-08-04 MED ORDER — PROPOFOL 10 MG/ML IV BOLUS
INTRAVENOUS | Status: AC
  Filled 2015-08-04: qty 20

## 2015-08-04 MED ORDER — LACTATED RINGERS IV SOLN
INTRAVENOUS | Status: DC
  Administered 2015-08-04: 1000 mL via INTRAVENOUS

## 2015-08-04 MED ORDER — PROPOFOL 500 MG/50ML IV EMUL
INTRAVENOUS | Status: DC | PRN
  Administered 2015-08-04: 80 mg via INTRAVENOUS
  Administered 2015-08-04: 20 mg via INTRAVENOUS

## 2015-08-04 MED ORDER — LIDOCAINE HCL (CARDIAC) 20 MG/ML IV SOLN
INTRAVENOUS | Status: AC
  Filled 2015-08-04: qty 5

## 2015-08-04 MED ORDER — PROPOFOL 10 MG/ML IV BOLUS
INTRAVENOUS | Status: AC
  Filled 2015-08-04: qty 40

## 2015-08-04 MED ORDER — PROPOFOL 500 MG/50ML IV EMUL
INTRAVENOUS | Status: DC | PRN
  Administered 2015-08-04: 140 ug/kg/min via INTRAVENOUS

## 2015-08-04 MED ORDER — LIDOCAINE HCL (CARDIAC) 20 MG/ML IV SOLN
INTRAVENOUS | Status: DC | PRN
  Administered 2015-08-04: 40 mg via INTRATRACHEAL

## 2015-08-04 MED ORDER — SODIUM CHLORIDE 0.9 % IV SOLN: INTRAVENOUS | Status: DC

## 2015-08-04 SURGICAL SUPPLY — 21 items

## 2015-08-04 NOTE — Discharge Instructions (Signed)

## 2015-08-04 NOTE — Anesthesia Postprocedure Evaluation (Signed)
Anesthesia Post Note  Patient: Tony [name]  Procedure(s) Performed: Procedure(s) (LRB): COLONOSCOPY WITH PROPOFOL (N/A)  Patient location during evaluation: PACU Anesthesia Type: MAC Level of consciousness: awake and alert Pain management: pain level controlled Vital Signs Assessment: post-procedure vital signs reviewed and stable Respiratory status: spontaneous breathing, nonlabored ventilation, respiratory function stable and patient connected to nasal cannula oxygen Cardiovascular status: stable and blood pressure returned to baseline Anesthetic complications: no    Last Vitals:  Filed Vitals:   08/04/15 1210 08/04/15 1220  BP: 139/77 163/98  Pulse: 55 51  Temp:    Resp: 19 18    Last Pain: There were no vitals filed for this visit.               Scottie Metayer J

## 2015-08-04 NOTE — H&P (Signed)
  Procedure: Diagnostic colonoscopy to evaluate hematochezia. 06/08/2010 normal screening colonoscopy was performed  History: The patient is a 55 year old male born May 28, 1960. When he developed painless hematochezia associated with bowel movements he was prescribed mesalamine suppositories. Within one week of starting mesalamine rectal suppositories, his hematochezia resolved. He is scheduled to undergo diagnostic colonoscopy today  Past medical history: Cardiac ablation performed to treat atrial fibrillation. Obstructive sleep apnea syndrome. Fatty appearing liver. Hypertension. Herniorrhaphy. Appendectomy. Ventral hernia repair.  Medication allergies: Lisinopril causes cough  Exam: The patient is alert and lying comfortably on the endoscopy stretcher. Abdomen is soft and nontender to palpation. Lungs are clear to auscultation. Cardiac exam reveals a regular rhythm.  Plan: Proceed with diagnostic colonoscopy to evaluate resolved painless hematochezia

## 2015-08-04 NOTE — Transfer of Care (Signed)
Immediate Anesthesia Transfer of Care Note  Patient: Tony [name]  Procedure(s) Performed: Procedure(s): COLONOSCOPY WITH PROPOFOL (N/A)  Patient Location: PACU  Anesthesia Type:MAC  Level of Consciousness: awake, alert  and oriented  Airway & Oxygen Therapy: Patient Spontanous Breathing and Patient connected to face mask oxygen  Post-op Assessment: Report given to RN and Post -op Vital signs reviewed and stable  Post vital signs: Reviewed and stable  Last Vitals:  Filed Vitals:   08/04/15 1016  BP: 170/96  Pulse: 70  Temp: 36.7 C  Resp: 17    Complications: No apparent anesthesia complications

## 2015-08-04 NOTE — Op Note (Addendum)
[facility] Patient Name: Tony [name] Procedure Date: 08/04/2015 MRN: [id number] Attending MD: Garlan Fair , MD Date of Birth: 22-Apr-1961 CSN:  Age: 55 Admit Type: Outpatient Procedure:                Colonoscopy Indications:              Hematochezia Providers:                Garlan Fair, MD, Carolynn Comment, RN,                            William Dalton, Technician Referring MD:              Medicines:                Propofol per Anesthesia Complications:            No immediate complications. Estimated Blood Loss:     Estimated blood loss: none. Procedure:                Pre-Anesthesia Assessment:                           - Prior to the procedure, a History and Physical                            was performed, and patient medications and                            allergies were reviewed. The patient's tolerance of                            previous anesthesia was also reviewed. The risks                            and benefits of the procedure and the sedation                            options and risks were discussed with the patient.                            All questions were answered, and informed consent                            was obtained. Prior Anticoagulants: The patient has                            taken no previous anticoagulant or antiplatelet                            agents. ASA Grade Assessment: II - A patient with                            mild systemic disease. After reviewing the risks                            and benefits,  the patient was deemed in                            satisfactory condition to undergo the procedure.                           After obtaining informed consent, the colonoscope                            was passed under direct vision. Throughout the                            procedure, the patient's blood pressure, pulse, and                            oxygen saturations were monitored  continuously. The                            Colonoscope was introduced through the anus and                            advanced to the the cecum, identified by                            appendiceal orifice and ileocecal valve. The                            colonoscopy was technically difficult and complex                            due to significant looping. The patient tolerated                            the procedure well. The quality of the bowel                            preparation was good. The ileocecal valve, the                            appendiceal orifice and the rectum were                            photographed. Scope In: Scope Out: Findings:      The perianal and digital rectal examinations were normal.      A 6 mm polyp was found in the rectum. The polyp was pedunculated. The       polyp was removed with a hot snare. Resection and retrieval were       complete.      The exam was otherwise without abnormality. Impression:               - One 6 mm polyp in the rectum, removed with a hot                            snare. Resected and retrieved.                           -  The examination was otherwise normal. Moderate Sedation:      N/A- Per Anesthesia Care Recommendation:           - Repeat colonoscopy date to be determined after                            pending pathology results are reviewed for                            surveillance based on pathology results.                           - Resume previous diet.                           - Continue present medications. Procedure Code(s):        --- Professional ---                           [id number], Colonoscopy, flexible; with removal of                            tumor(s), polyp(s), or other lesion(s) by snare                            technique Diagnosis Code(s):        --- Professional ---                           K62.1, Rectal polyp                           K92.1, Melena (includes Hematochezia) CPT  copyright 2016 American Medical Association. All rights reserved. The codes documented in this report are preliminary and upon coder review may  be revised to meet current compliance requirements. Earle Gell, MD Garlan Fair, MD 08/04/2015 12:04:07 PM This report has been signed electronically. Number of Addenda: 0

## 2015-08-04 NOTE — Anesthesia Preprocedure Evaluation (Addendum)
Anesthesia Evaluation  Patient identified by MRN, date of birth, ID band Patient awake    Reviewed: Allergy & Precautions, NPO status , Patient's Chart, lab work & pertinent test results  Airway Mallampati: II  TM Distance: >3 FB Neck ROM: Full    Dental no notable dental hx.    Pulmonary sleep apnea ,    Pulmonary exam normal breath sounds clear to auscultation       Cardiovascular hypertension, Pt. on medications Normal cardiovascular exam Rhythm:Regular Rate:Normal     Neuro/Psych negative neurological ROS  negative psych ROS   GI/Hepatic negative GI ROS, Neg liver ROS,   Endo/Other  negative endocrine ROS  Renal/GU negative Renal ROS  negative genitourinary   Musculoskeletal negative musculoskeletal ROS (+)   Abdominal   Peds negative pediatric ROS (+)  Hematology negative hematology ROS (+)   Anesthesia Other Findings   Reproductive/Obstetrics negative OB ROS                            Anesthesia Physical Anesthesia Plan  ASA: III  Anesthesia Plan: MAC   Post-op Pain Management:    Induction: Intravenous  Airway Management Planned: Natural Airway  Additional Equipment:   Intra-op Plan:   Post-operative Plan:   Informed Consent: I have reviewed the patients History and Physical, chart, labs and discussed the procedure including the risks, benefits and alternatives for the proposed anesthesia with the patient or authorized representative who has indicated his/her understanding and acceptance.   Dental advisory given  Plan Discussed with: CRNA  Anesthesia Plan Comments:         Anesthesia Quick Evaluation

## 2015-08-05 ENCOUNTER — Encounter (HOSPITAL_COMMUNITY): Payer: Self-pay | Admitting: Gastroenterology

## 2016-08-12 DIAGNOSIS — G4733 Obstructive sleep apnea (adult) (pediatric): Secondary | ICD-10-CM | POA: Diagnosis not present

## 2016-08-31 DIAGNOSIS — R972 Elevated prostate specific antigen [PSA]: Secondary | ICD-10-CM | POA: Diagnosis not present

## 2016-12-20 DIAGNOSIS — D2312 Other benign neoplasm of skin of left eyelid, including canthus: Secondary | ICD-10-CM | POA: Diagnosis not present

## 2016-12-20 DIAGNOSIS — H10503 Unspecified blepharoconjunctivitis, bilateral: Secondary | ICD-10-CM | POA: Diagnosis not present

## 2017-04-08 DIAGNOSIS — R82998 Other abnormal findings in urine: Secondary | ICD-10-CM | POA: Diagnosis not present

## 2017-04-08 DIAGNOSIS — E559 Vitamin D deficiency, unspecified: Secondary | ICD-10-CM | POA: Diagnosis not present

## 2017-04-08 DIAGNOSIS — I10 Essential (primary) hypertension: Secondary | ICD-10-CM | POA: Diagnosis not present

## 2017-04-08 DIAGNOSIS — Z0000 Encounter for general adult medical examination without abnormal findings: Secondary | ICD-10-CM | POA: Diagnosis not present

## 2017-04-11 DIAGNOSIS — I10 Essential (primary) hypertension: Secondary | ICD-10-CM | POA: Diagnosis not present

## 2017-04-11 DIAGNOSIS — Z0000 Encounter for general adult medical examination without abnormal findings: Secondary | ICD-10-CM | POA: Diagnosis not present

## 2017-04-11 DIAGNOSIS — K649 Unspecified hemorrhoids: Secondary | ICD-10-CM | POA: Diagnosis not present

## 2017-04-11 DIAGNOSIS — K760 Fatty (change of) liver, not elsewhere classified: Secondary | ICD-10-CM | POA: Diagnosis not present

## 2017-04-15 DIAGNOSIS — Z1212 Encounter for screening for malignant neoplasm of rectum: Secondary | ICD-10-CM | POA: Diagnosis not present

## 2017-04-25 DIAGNOSIS — G4733 Obstructive sleep apnea (adult) (pediatric): Secondary | ICD-10-CM | POA: Diagnosis not present

## 2017-06-20 DIAGNOSIS — H43811 Vitreous degeneration, right eye: Secondary | ICD-10-CM | POA: Diagnosis not present

## 2017-07-27 DIAGNOSIS — G4733 Obstructive sleep apnea (adult) (pediatric): Secondary | ICD-10-CM | POA: Diagnosis not present

## 2017-10-26 DIAGNOSIS — H43811 Vitreous degeneration, right eye: Secondary | ICD-10-CM | POA: Diagnosis not present

## 2017-11-03 DIAGNOSIS — G4733 Obstructive sleep apnea (adult) (pediatric): Secondary | ICD-10-CM | POA: Diagnosis not present

## 2017-11-16 DIAGNOSIS — S6391XA Sprain of unspecified part of right wrist and hand, initial encounter: Secondary | ICD-10-CM | POA: Diagnosis not present

## 2017-11-16 DIAGNOSIS — S63501A Unspecified sprain of right wrist, initial encounter: Secondary | ICD-10-CM | POA: Diagnosis not present

## 2017-11-16 DIAGNOSIS — M25531 Pain in right wrist: Secondary | ICD-10-CM | POA: Diagnosis not present

## 2017-11-16 DIAGNOSIS — M79641 Pain in right hand: Secondary | ICD-10-CM | POA: Diagnosis not present

## 2018-04-10 DIAGNOSIS — R82998 Other abnormal findings in urine: Secondary | ICD-10-CM | POA: Diagnosis not present

## 2018-04-10 DIAGNOSIS — Z0000 Encounter for general adult medical examination without abnormal findings: Secondary | ICD-10-CM | POA: Diagnosis not present

## 2018-04-11 DIAGNOSIS — Z1212 Encounter for screening for malignant neoplasm of rectum: Secondary | ICD-10-CM | POA: Diagnosis not present

## 2018-04-20 DIAGNOSIS — R972 Elevated prostate specific antigen [PSA]: Secondary | ICD-10-CM | POA: Diagnosis not present

## 2018-04-20 DIAGNOSIS — E559 Vitamin D deficiency, unspecified: Secondary | ICD-10-CM | POA: Diagnosis not present

## 2018-04-20 DIAGNOSIS — I10 Essential (primary) hypertension: Secondary | ICD-10-CM | POA: Diagnosis not present

## 2018-04-20 DIAGNOSIS — Z0000 Encounter for general adult medical examination without abnormal findings: Secondary | ICD-10-CM | POA: Diagnosis not present

## 2018-04-20 DIAGNOSIS — Z23 Encounter for immunization: Secondary | ICD-10-CM | POA: Diagnosis not present

## 2018-04-20 DIAGNOSIS — Z1389 Encounter for screening for other disorder: Secondary | ICD-10-CM | POA: Diagnosis not present

## 2018-05-04 DIAGNOSIS — R509 Fever, unspecified: Secondary | ICD-10-CM | POA: Diagnosis not present

## 2018-05-04 DIAGNOSIS — R05 Cough: Secondary | ICD-10-CM | POA: Diagnosis not present

## 2018-05-18 DIAGNOSIS — H43813 Vitreous degeneration, bilateral: Secondary | ICD-10-CM | POA: Diagnosis not present

## 2018-07-05 DIAGNOSIS — L821 Other seborrheic keratosis: Secondary | ICD-10-CM | POA: Diagnosis not present

## 2018-07-05 DIAGNOSIS — L570 Actinic keratosis: Secondary | ICD-10-CM | POA: Diagnosis not present

## 2018-07-05 DIAGNOSIS — L738 Other specified follicular disorders: Secondary | ICD-10-CM | POA: Diagnosis not present

## 2018-07-05 DIAGNOSIS — D225 Melanocytic nevi of trunk: Secondary | ICD-10-CM | POA: Diagnosis not present

## 2019-02-07 ENCOUNTER — Ambulatory Visit (INDEPENDENT_AMBULATORY_CARE_PROVIDER_SITE_OTHER): Payer: 59 | Admitting: Cardiovascular Disease

## 2019-02-07 ENCOUNTER — Other Ambulatory Visit: Payer: Self-pay

## 2019-02-07 ENCOUNTER — Encounter: Payer: Self-pay | Admitting: Cardiovascular Disease

## 2019-02-07 VITALS — BP 147/91 | HR 89 | Ht 76.0 in | Wt 284.0 lb

## 2019-02-07 DIAGNOSIS — Z008 Encounter for other general examination: Secondary | ICD-10-CM

## 2019-02-07 DIAGNOSIS — I10 Essential (primary) hypertension: Secondary | ICD-10-CM | POA: Insufficient documentation

## 2019-02-07 DIAGNOSIS — G4733 Obstructive sleep apnea (adult) (pediatric): Secondary | ICD-10-CM

## 2019-02-07 DIAGNOSIS — R0602 Shortness of breath: Secondary | ICD-10-CM | POA: Diagnosis not present

## 2019-02-07 DIAGNOSIS — R0609 Other forms of dyspnea: Secondary | ICD-10-CM

## 2019-02-07 DIAGNOSIS — R0600 Dyspnea, unspecified: Secondary | ICD-10-CM

## 2019-02-07 DIAGNOSIS — I4891 Unspecified atrial fibrillation: Secondary | ICD-10-CM

## 2019-02-07 MED ORDER — METOPROLOL TARTRATE 100 MG PO TABS: 100.0000 mg | ORAL_TABLET | Freq: Once | ORAL | 0 refills | Status: DC

## 2019-02-07 NOTE — Assessment & Plan Note (Signed)
History of obstructive sleep apnea on CPAP which he benefits from 

## 2019-02-07 NOTE — Assessment & Plan Note (Signed)
History of essential hypertension with blood pressure measured at 152/91.  He is on losartan and hydrochlorothiazide.

## 2019-02-07 NOTE — Progress Notes (Signed)
02/07/2019 Tony [name]   01/21/1961  [id number]  Primary Physician [name], MD Primary Cardiologist: Lorretta Harp MD Lupe Carney, [place]  HPI:  Tony [name] is a 58 y.o.  ([name]) moderately overweight married Caucasian male father of 17, grandfather of 4 grandchildren referred by Dr. Ardeth Perfect for cardiovascular valuation because of new onset dyspnea.  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  He has no cardiac risk factors other than hypertension on losartan and hydrochlorothiazide.  His mother did have a myocardial infarction age 59.  He is never had a heart attack or stroke.  He denies chest pain but has noticed increasing dyspnea on exertion over the last year.  He has never smoked.  Symptoms somewhat improved with inhaled bronchodilators.  He does have obstructive sleep apnea on CPAP.   Current Meds  Medication Sig  . albuterol (VENTOLIN HFA) 108 (90 Base) MCG/ACT inhaler   . cholecalciferol (VITAMIN D) 1000 UNITS tablet Take 1,000 Units by mouth daily.   Marland Kitchen losartan-hydrochlorothiazide (HYZAAR) 100-12.5 MG tablet TK 1 T PO D  . [DISCONTINUED] lisinopril (PRINIVIL,ZESTRIL) 10 MG tablet Take 10 mg by mouth every morning.      No Known Allergies  Social History   Socioeconomic History  . Marital status: Married    Spouse name: Not on file  . Number of children: Not on file  . Years of education: Not on file  . Highest education level: Not on file  Occupational History  . Not on file  Social Needs  . Financial resource strain: Not on file  . Food insecurity    Worry: Not on file    Inability: Not on file  . Transportation needs    Medical: Not on file    Non-medical: Not on file  Tobacco Use  . Smoking status: Never Smoker  . Smokeless tobacco: Never Used  Substance and Sexual Activity  . Alcohol use: Yes    Comment: occasional  . Drug use: No  . Sexual activity: Not on file  Lifestyle  . Physical activity    Days per week: Not on  file    Minutes per session: Not on file  . Stress: Not on file  Relationships  . Social Herbalist on phone: Not on file    Gets together: Not on file    Attends religious service: Not on file    Active member of club or organization: Not on file    Attends meetings of clubs or organizations: Not on file    Relationship status: Not on file  . Intimate partner violence    Fear of current or ex partner: Not on file    Emotionally abused: Not on file    Physically abused: Not on file    Forced sexual activity: Not on file  Other Topics Concern  . Not on file  Social History Narrative  . Not on file     Review of Systems: General: negative for chills, fever, night sweats or weight changes.  Cardiovascular: negative for chest pain, dyspnea on exertion, edema, orthopnea, palpitations, paroxysmal nocturnal dyspnea or shortness of breath Dermatological: negative for rash Respiratory: negative for cough or wheezing Urologic: negative for hematuria Abdominal: negative for nausea, vomiting, diarrhea, bright red blood per rectum, melena, or hematemesis Neurologic: negative for visual changes, syncope, or dizziness All other systems reviewed and are otherwise negative except as noted above.    Blood pressure Marland Kitchen)  152/91, pulse 89, height 6\' 4"  (1.93 m), weight 284 lb (128.8 kg), SpO2 98 %.  General appearance: alert and no distress Neck: no adenopathy, no carotid bruit, no JVD, supple, symmetrical, trachea midline and thyroid not enlarged, symmetric, no tenderness/mass/nodules Lungs: clear to auscultation bilaterally Heart: regular rate and rhythm, S1, S2 normal, no murmur, click, rub or gallop Extremities: extremities normal, atraumatic, no cyanosis or edema Pulses: 2+ and symmetric Skin: Skin color, texture, turgor normal. No rashes or lesions Neurologic: Alert and oriented X 3, normal strength and tone. Normal symmetric reflexes. Normal coordination and gait  EKG sinus  rhythm 89 without ST or T wave changes.  I personally reviewed this EKG.  ASSESSMENT AND PLAN:   Atrial fibrillation [facility]) Remote history of atrial fibrillation followed by Dr. Caryl Comes in the past status post A. fib ablation at [facility] without recurrence  Dyspnea on exertion 1 year history of progressive dyspnea exertion.  No prior history of tobacco abuse.  Symptoms somewhat improved with albuterol.  The patient denies chest pain.  His only risk factor is family history with a mother who had a myocardial infarction in her later years at 60 years old.  I am going get a 2D echocardiogram and a coronary CTA to further evaluate.  Essential hypertension History of essential hypertension with blood pressure measured at 152/91.  He is on losartan and hydrochlorothiazide.  Obstructive sleep apnea History of obstructive sleep apnea on CPAP which he benefits from      Lorretta Harp MD [facility], [facility] 02/07/2019 2:18 PM

## 2019-02-07 NOTE — Assessment & Plan Note (Signed)
1 year history of progressive dyspnea exertion.  No prior history of tobacco abuse.  Symptoms somewhat improved with albuterol.  The patient denies chest pain.  His only risk factor is family history with a mother who had a myocardial infarction in her later years at 60 years old.  I am going get a 2D echocardiogram and a coronary CTA to further evaluate.

## 2019-02-07 NOTE — Patient Instructions (Addendum)
Cardiac CT status: To be scheduled  Your cardiac CT will be scheduled at one of the below locations:   [facility] [address] [place], [place] [address] [phone]  [place] [address] [place], [place] [address] [phone]  If scheduled at [facility], please arrive at the [facility] main entrance of [facility] 30-45 minutes prior to test start time. Proceed to the [facility] Radiology Department (first floor) to check-in and test prep.  If scheduled at [facility], please arrive 15 mins early for check-in and test prep.  Please follow these instructions carefully (unless otherwise directed):  Lab work: Your physician recommends that you return for lab work 3-7 days prior to your coronary CTA ([place])  If you have labs (blood work) drawn today and your tests are completely normal, you will receive your results only by: Marland Kitchen MyChart Message (if you have MyChart) OR . A paper copy in the mail If you have any lab test that is abnormal or we need to change your treatment, we will call you to review the results.  Hold all erectile dysfunction medications at least 3 days (72 hrs) prior to test.  On the Night Before the Test: . Be sure to Drink plenty of water. . Do not consume any caffeinated/decaffeinated beverages or chocolate 12 hours prior to your test. . Do not take any antihistamines 12 hours prior to your test.   On the Day of the Test: . Drink plenty of water. Do not drink any water within one hour of the test. . Do not eat any food 4 hours prior to the test. . You may take your regular medications prior to the test.  . Take metoprolol (Lopressor) 100 mg two hours prior to test. . HOLD losartan-Hydrochlorothiazide (contains diuretic) on the morning of the test.       After the Test: . Drink plenty of  water. . After receiving IV contrast, you may experience a mild flushed feeling. This is normal. . On occasion, you may experience a mild rash up to 24 hours after the test. This is not dangerous. If this occurs, you can take Benadryl 25 mg and increase your fluid intake. . If you experience trouble breathing, this can be serious. If it is severe call 911 IMMEDIATELY. If it is mild, please call our office.    Please contact the cardiac imaging nurse navigator should you have any questions/concerns Marchia Bond, RN Navigator Cardiac Imaging [facility] [phone] Office  [phone] Cell   ______________________________________________________________  Testing/Procedures: Your physician has requested that you have an echocardiogram. Echocardiography is a painless test that uses sound waves to create images of your heart. It provides your doctor with information about the size and shape of your heart and how well your heart's chambers and valves are working. This procedure takes approximately one hour. There are no restrictions for this procedure. LOCATION: [facility]: [place], [place], [place] [address] TO BE SCHEDULED   Follow-Up: At [facility], you and your health needs are our priority.  As part of our continuing mission to provide you with exceptional heart care, we have created designated Provider Care Teams.  These Care Teams include your primary Cardiologist (physician) and Advanced Practice Providers (APPs -  Physician Assistants and Nurse Practitioners) who all work together to provide you with  the care you need, when you need it. . You may schedule a follow up appointment AS NEEDED. You may see Dr. Gwenlyn Found or one of the following Advanced Practice Providers on your designated Care Team:   . Kerin Ransom, PA-C . Daleen Snook Kroeger, PA-C . Sande Rives, PA-C ____________________

## 2019-02-07 NOTE — Assessment & Plan Note (Signed)
Remote history of atrial fibrillation followed by Dr. Caryl Comes in the past status post A. fib ablation at [facility] without recurrence

## 2019-02-08 ENCOUNTER — Other Ambulatory Visit: Payer: Self-pay | Admitting: Cardiovascular Disease

## 2019-02-09 ENCOUNTER — Other Ambulatory Visit: Payer: Self-pay | Admitting: Cardiovascular Disease

## 2019-02-12 ENCOUNTER — Other Ambulatory Visit: Payer: Self-pay

## 2019-02-12 ENCOUNTER — Other Ambulatory Visit: Payer: Self-pay | Admitting: *Deleted

## 2019-02-12 ENCOUNTER — Ambulatory Visit (HOSPITAL_COMMUNITY): Payer: 59 | Attending: Internal Medicine

## 2019-02-12 DIAGNOSIS — R0609 Other forms of dyspnea: Secondary | ICD-10-CM | POA: Insufficient documentation

## 2019-02-12 DIAGNOSIS — R0600 Dyspnea, unspecified: Secondary | ICD-10-CM

## 2019-02-12 DIAGNOSIS — Z20822 Contact with and (suspected) exposure to covid-19: Secondary | ICD-10-CM

## 2019-02-12 NOTE — Progress Notes (Signed)
2D Echocardiogram has been performed. [facility] Khali Albanese RDCS 02/12/2019 10:03am

## 2019-02-13 LAB — NOVEL CORONAVIRUS, NAA: SARS-CoV-2, NAA: NOT DETECTED

## 2019-03-01 ENCOUNTER — Other Ambulatory Visit: Payer: Self-pay

## 2019-03-01 DIAGNOSIS — Z20822 Contact with and (suspected) exposure to covid-19: Secondary | ICD-10-CM

## 2019-03-03 LAB — NOVEL CORONAVIRUS, NAA: SARS-CoV-2, NAA: NOT DETECTED

## 2019-05-23 ENCOUNTER — Encounter (HOSPITAL_COMMUNITY): Payer: Self-pay

## 2019-05-23 ENCOUNTER — Telehealth (HOSPITAL_COMMUNITY): Payer: Self-pay | Admitting: Emergency Medicine

## 2019-05-23 NOTE — Telephone Encounter (Signed)
Reaching out to patient to offer assistance regarding upcoming cardiac imaging study; pt verbalizes understanding of appt date/time, parking situation and where to check in, pre-test NPO status and medications ordered, and verified current allergies; name and call back number provided for further questions should they arise Latice Waitman RN Navigator Cardiac Imaging [facility] [phone] office [phone] cell 

## 2019-05-24 ENCOUNTER — Ambulatory Visit (HOSPITAL_COMMUNITY)
Admission: RE | Admit: 2019-05-24 | Discharge: 2019-05-24 | Disposition: A | Discharge status: Home / Self Care | Payer: 59 | Source: Ambulatory Visit | Attending: Cardiovascular Disease | Admitting: Cardiovascular Disease

## 2019-05-24 ENCOUNTER — Other Ambulatory Visit: Payer: Self-pay

## 2019-05-24 DIAGNOSIS — R0609 Other forms of dyspnea: Secondary | ICD-10-CM

## 2019-05-24 DIAGNOSIS — R0600 Dyspnea, unspecified: Secondary | ICD-10-CM | POA: Diagnosis present

## 2019-05-24 DIAGNOSIS — R0602 Shortness of breath: Secondary | ICD-10-CM | POA: Insufficient documentation

## 2019-05-24 MED ORDER — IOHEXOL 350 MG/ML SOLN
80.0000 mL | Freq: Once | INTRAVENOUS | Status: AC | PRN
  Administered 2019-05-24: 80 mL via INTRAVENOUS

## 2019-05-24 MED ORDER — NITROGLYCERIN 0.4 MG SL SUBL
SUBLINGUAL_TABLET | SUBLINGUAL | Status: AC
  Filled 2019-05-24: qty 2

## 2019-05-24 MED ORDER — NITROGLYCERIN 0.4 MG SL SUBL
0.8000 mg | SUBLINGUAL_TABLET | Freq: Once | SUBLINGUAL | Status: AC
  Administered 2019-05-24: 0.8 mg via SUBLINGUAL

## 2020-01-07 ENCOUNTER — Other Ambulatory Visit: Payer: Self-pay | Admitting: Urology

## 2020-01-07 DIAGNOSIS — R972 Elevated prostate specific antigen [PSA]: Secondary | ICD-10-CM

## 2020-02-05 ENCOUNTER — Other Ambulatory Visit: Payer: 59

## 2020-02-05 ENCOUNTER — Ambulatory Visit
Admission: RE | Admit: 2020-02-05 | Discharge: 2020-02-05 | Disposition: A | Discharge status: Home / Self Care | Payer: 59 | Source: Ambulatory Visit | Attending: Urology | Admitting: Urology

## 2020-02-05 DIAGNOSIS — R972 Elevated prostate specific antigen [PSA]: Secondary | ICD-10-CM

## 2020-02-05 MED ORDER — GADOBENATE DIMEGLUMINE 529 MG/ML IV SOLN
20.0000 mL | Freq: Once | INTRAVENOUS | Status: AC | PRN
  Administered 2020-02-05: 20 mL via INTRAVENOUS

## 2020-04-03 ENCOUNTER — Encounter: Payer: Self-pay | Admitting: Medical Oncology

## 2020-04-03 NOTE — Progress Notes (Signed)
I called pt to introduce myself as the Prostate Nurse Navigator and the Coordinator of the Prostate [place].  1. I confirmed with the patient he is aware of his referral to the clinic 04/09/19 arriving at 12:30 pm. I reviewed location, registration and COVID protocol. I asked him to have lunch prior to arrival.   2. I discussed the format of the clinic and the physicians he will be seeing that day.  3. I discussed where the clinic is located and how to contact me.  4. I confirmed his address and informed him I would be mailing a packet of information and forms to be completed. I asked him to bring them with him the day of his appointment.   He voiced understanding of the above. I asked him to call me if he has any questions or concerns regarding his appointments or the forms he needs to complete.

## 2020-04-07 ENCOUNTER — Encounter: Payer: Self-pay | Admitting: Medical Oncology

## 2020-04-07 ENCOUNTER — Encounter: Payer: Self-pay | Admitting: Radiation Oncology

## 2020-04-07 NOTE — Progress Notes (Signed)
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 Prostate volume: 60.8 grams  Sherle Poe presented to Dr. Alinda Money in January 2020 after Dr. [name] noted an elevated PSA.  Biopsies of prostate (if applicable) revealed:   Past/Anticipated interventions by urology, if any: prostate biopsy, referral to [facility]  Past/Anticipated interventions by medical oncology, if any: no  Weight changes, if any: denies  Bowel/Bladder complaints, if any: IPSS 8. SHIM  22. Denies dysuria, hematuria, urinary leakage or incontinence. Denies any bowel complaints.   Nausea/Vomiting, if any: denies  Pain issues, if any:  denies  SAFETY ISSUES:  Prior radiation? denies  Pacemaker/ICD? denies  Possible current pregnancy? no, male patient  Is the patient on methotrexate? denies  Current Complaints / other details:  59 year old male. Married with four children. Works as an Electrical engineer. Resides in [place]. Father with history of prostate cancer tx with brachytherapy. Also, patient's uncle with history of prostate ca.

## 2020-04-07 NOTE — Progress Notes (Signed)
Spoke with patient to confirm appointment to [facility] 11/23, arriving @ 12:30 pm. I reminded him to bring his completed medical forms and to have lunch before arrival.

## 2020-04-08 ENCOUNTER — Encounter: Payer: Self-pay | Admitting: General Practice

## 2020-04-08 ENCOUNTER — Encounter: Payer: Self-pay | Admitting: Radiation Oncology

## 2020-04-08 ENCOUNTER — Inpatient Hospital Stay: Payer: 59 | Attending: Oncology | Admitting: Oncology

## 2020-04-08 ENCOUNTER — Ambulatory Visit
Admission: RE | Admit: 2020-04-08 | Discharge: 2020-04-08 | Disposition: A | Discharge status: Home / Self Care | Payer: 59 | Source: Ambulatory Visit | Attending: Radiation Oncology | Admitting: Radiation Oncology

## 2020-04-08 ENCOUNTER — Encounter: Payer: Self-pay | Admitting: Medical Oncology

## 2020-04-08 ENCOUNTER — Other Ambulatory Visit: Payer: Self-pay

## 2020-04-08 VITALS — BP 146/94 | HR 77 | Temp 97.6°F | Resp 20 | Ht 76.0 in | Wt 294.4 lb

## 2020-04-08 DIAGNOSIS — G4730 Sleep apnea, unspecified: Secondary | ICD-10-CM | POA: Insufficient documentation

## 2020-04-08 DIAGNOSIS — C61 Malignant neoplasm of prostate: Secondary | ICD-10-CM | POA: Diagnosis present

## 2020-04-08 DIAGNOSIS — Z79899 Other long term (current) drug therapy: Secondary | ICD-10-CM | POA: Insufficient documentation

## 2020-04-08 DIAGNOSIS — I10 Essential (primary) hypertension: Secondary | ICD-10-CM | POA: Insufficient documentation

## 2020-04-08 DIAGNOSIS — Z8042 Family history of malignant neoplasm of prostate: Secondary | ICD-10-CM

## 2020-04-08 DIAGNOSIS — Z803 Family history of malignant neoplasm of breast: Secondary | ICD-10-CM | POA: Diagnosis not present

## 2020-04-08 DIAGNOSIS — I4891 Unspecified atrial fibrillation: Secondary | ICD-10-CM | POA: Diagnosis not present

## 2020-04-08 HISTORY — DX: Malignant neoplasm of prostate: C61

## 2020-04-08 NOTE — Progress Notes (Signed)
Reason for the request:   Prostate cancer  HPI: I was asked by Dr. Alinda Money  to evaluate Tony [name] for the evaluation of prostate cancer.  He is a 59 year old man with history of atrial fibrillation and hypertension.  He was found to have an elevated PSA of 5.25 in January 2021.  His baseline was 2.81.  His PSA continues to fluctuate and repeat count was 3.7 and subsequently was 4.07 in August 2021.  He has subsequently underwent MRI of the prostate on February 05, 2020 which showed 2 cm ill-defined nodule in the apex of the prostate with PI-RADS 4.  A 1.6 cm peripheral zone nodule is intermediate with PI-RADS 3.  He has subsequently underwent targeted biopsy on March 14, 2020 which showed 4 cores of 3+3 = 6 in the prostate and additional 4 cores of 3+3 = 6 approaching 50% of the targeted lesions.  Medically, he reports no major complaints at this time.  Denies any bone pain pathological fractures.  He denies any major changes in his urinary symptoms.   He does not report any headaches, blurry vision, syncope or seizures. Does not report any fevers, chills or sweats.  Does not report any cough, wheezing or hemoptysis.  Does not report any chest pain, palpitation, orthopnea or leg edema.  Does not report any nausea, vomiting or abdominal pain.  Does not report any constipation or diarrhea.  Does not report any skeletal complaints.    Does not report frequency, urgency or hematuria.  Does not report any skin rashes or lesions. Does not report any heat or cold intolerance.  Does not report any lymphadenopathy or petechiae.  Does not report any anxiety or depression.  Remaining review of systems is negative.    Past Medical History:  Diagnosis Date  . A-fib [facility]) 2008   ablation done  . Hemorrhoid thrombosis 2012   self -resolved  . Hypertension   . Prostate cancer ([place])   . Recurrent ventral periumbilical incisional hernia s/p repair 07/06/2012 03/16/2011  . Sleep apnea    pt does not know settings    . Ventral hernia   :  Past Surgical History:  Procedure Laterality Date  . ABLATION SAPHENOUS VEIN W/ RFA  2008  . APPENDECTOMY  Jan 2010   lap w Primary UHR  . CARDIOVERSION    . COLONOSCOPY WITH PROPOFOL N/A 08/04/2015   Procedure: COLONOSCOPY WITH PROPOFOL;  Surgeon: Garlan Fair, MD;  Location: [facility] ENDOSCOPY;  Service: Endoscopy;  Laterality: N/A;  . [place]  . HERNIA REPAIR  2010   Primary umb hernia w appy  . INSERTION OF MESH N/A 07/06/2012   Procedure: INSERTION OF MESH;  Surgeon: Adin Hector, MD;  Location: [facility] ORS;  Service: General;  Laterality: N/A;  . PROSTATE BIOPSY    . VENTRAL HERNIA REPAIR N/A 07/06/2012   Procedure: LAPAROSCOPIC VENTRAL HERNIA;  Surgeon: Adin Hector, MD;  Location: [facility] ORS;  Service: General;  Laterality: N/A;  :   Current Outpatient Medications:  .  cholecalciferol (VITAMIN D) 1000 UNITS tablet, Take 1,000 Units by mouth daily. , Disp: , Rfl:  .  losartan-hydrochlorothiazide (HYZAAR) 100-25 MG tablet, Take 1 tablet by mouth daily., Disp: , Rfl: :  No Known Allergies:  Family History  Problem Relation Age of Onset  . Cancer Mother   . Heart disease Mother   . Cancer Father   . Prostate cancer Father   . Heart disease Daughter   .  Prostate cancer Paternal Uncle   . Breast cancer Neg Hx   . Colon cancer Neg Hx   . Pancreatic cancer Neg Hx   :  Social History   Socioeconomic History  . Marital status: Married    Spouse name: Not on file  . Number of children: Not on file  . Years of education: Not on file  . Highest education level: Not on file  Occupational History  . Not on file  Tobacco Use  . Smoking status: Never Smoker  . Smokeless tobacco: Never Used  Vaping Use  . Vaping Use: Never used  Substance and Sexual Activity  . Alcohol use: Yes    Comment: occasional  . Drug use: No  . Sexual activity: Yes  Other Topics Concern  . Not on file  Social History Narrative  . Not on file   Social  Determinants of Health   Financial Resource Strain:   . Difficulty of Paying Living Expenses: Not on file  Food Insecurity:   . Worried About Charity fundraiser in the Last Year: Not on file  . Ran Out of Food in the Last Year: Not on file  Transportation Needs:   . Lack of Transportation (Medical): Not on file  . Lack of Transportation (Non-Medical): Not on file  Physical Activity:   . Days of Exercise per Week: Not on file  . Minutes of Exercise per Session: Not on file  Stress:   . Feeling of Stress : Not on file  Social Connections:   . Frequency of Communication with Friends and Family: Not on file  . Frequency of Social Gatherings with Friends and Family: Not on file  . Attends Religious Services: Not on file  . Active Member of Clubs or Organizations: Not on file  . Attends Archivist Meetings: Not on file  . Marital Status: Not on file  Intimate Partner Violence:   . Fear of Current or Ex-Partner: Not on file  . Emotionally Abused: Not on file  . Physically Abused: Not on file  . Sexually Abused: Not on file  :   Assessment and Plan:   59 year old man with prostate cancer diagnosed in October 2021.  He presented with a Gleason score 3+3 = 6 and a PSA of 4.  He underwent MRI targeted biopsy with total of 8 cores with Gleason score 3+3 = 6 with the highest density showed 50% involvement with cancer.  His case was discussed today the prostate cancer multidisciplinary clinic including review with reviewing pathologist as well as discussion of his MRI results with the reviewing radiologist.  Treatment options were reviewed at this time.  Although he has low risk disease, he has high volume of cores although only 1 of those cores up to 50%.  The totality of the presentation might warrant treatment given his family history of prostate cancer.  Treatment options including radiation versus surgical intervention is a consideration.  The rationale for using active  surveillance was also discussed although might not be recommended it would not be completely unreasonable as long as close surveillance is instituted in case he develops Gleason 4 prostate cancer.  The risk of developing metastatic disease at this point based on his cancer is very low but if he progresses into Gleason 7 or higher than the risk is definitely different.  He will consider these options at this time in the near future.  All his questions were answered to his satisfaction.   [place]  minutes were dedicated to this visit. The time was spent on reviewing laboratory data, imaging studies, discussing treatment options,  and answering questions regarding future plan.      A copy of this consult has been forwarded to the requesting physician.

## 2020-04-08 NOTE — Progress Notes (Signed)
Radiation Oncology         [phone] ________________________________  Multidisciplinary Prostate Cancer Clinic  Initial Radiation Oncology Consultation  Name: Tony [name] MRN: [id number]  Date: 04/08/2020  DOB: 1960-11-30  CC:Tony Hatchet, MD  Tony Bring, MD   REFERRING PHYSICIAN: Raynelle Bring, MD  DIAGNOSIS: 59 y.o. 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 Malignant neoplasm of prostate ([place])  Tony [name] is a 59 y.o. gentleman.  He was noted to have an elevated PSA of 5.25 in 05/2019 by his primary care physician, Tony [name].  Accordingly, he was referred for evaluation in urology by Tony [name] on 06/13/2019,  digital rectal examination was performed at that time revealing no nodules. A repeat PSA performed that day had decreased to 3.73. Therefore, the patient elected to continue to monitor the PSA closely. A repeat PSA in 11/2019 was elevated for his age at 71.07, therefore, he proceeded to prostate MRI on 02/05/2020 for further evaluation.  This scan showed a 2 cm ill-defined PI-RADS 4 lesion in the apex involving the anterior transitional zone bilaterally as well as the right anterior peripheral zone and anterior fibromuscular stroma  Additionally, there was a 1.6 cm PI-RADS 3 lesion in the peripheral zone nodule at the right medial and lateral apex with focal capsular bulge at the site of nodularity in the right anterior and posterolateral apexes, suspicious for mild extracapsular extension. There was no evidence of pelvic lymphadenopathy or bone metastases. The prostate volume was estimated to be 63.4 mL.   PIRADS 4 lesion- anterior and extending into pelvic floor   PIRADS 3 lesion    The patient proceeded to MR fusion transrectal ultrasound with 12 biopsies of the prostate on 03/14/2020.  The prostate volume measured 60.8 cc by ultrasound.  Out of  20 core biopsies, 12 were positive.  The maximum Gleason score was 3+3, and this was seen in all 4 samples of each ROI MRI lesions, as well as in the left apex (small focus), right mid, right apex (small focus), and right apex lateral.     The patient reviewed the biopsy results with his urologist and he has kindly been referred today to the multidisciplinary prostate cancer clinic for presentation of pathology and radiology studies in our conference for discussion of potential radiation treatment options and clinical evaluation.    PREVIOUS RADIATION THERAPY: No  PAST MEDICAL HISTORY:  has a past medical history of A-fib ([place]) (2008), Hemorrhoid thrombosis (2012), Hypertension, Prostate cancer ([place]), Recurrent ventral periumbilical incisional hernia s/p repair 07/06/2012 (03/16/2011), Sleep apnea, and Ventral hernia.    PAST SURGICAL HISTORY: Past Surgical History:  Procedure Laterality Date  . ABLATION SAPHENOUS VEIN W/ RFA  2008  . APPENDECTOMY  Jan 2010   lap w Primary UHR  . CARDIOVERSION    . COLONOSCOPY WITH PROPOFOL N/A 08/04/2015   Procedure: COLONOSCOPY WITH PROPOFOL;  Surgeon: Tony Fair, MD;  Location: [facility];  Service: Endoscopy;  Laterality: N/A;  . [place]  . HERNIA REPAIR  2010   Primary umb hernia w appy  . INSERTION OF MESH N/A 07/06/2012   Procedure: INSERTION OF MESH;  Surgeon: Tony Hector, MD;  Location: [facility];  Service: General;  Laterality: N/A;  . PROSTATE BIOPSY    . VENTRAL HERNIA REPAIR N/A 07/06/2012  Procedure: LAPAROSCOPIC VENTRAL HERNIA;  Surgeon: Tony Hector, MD;  Location: [facility];  Service: General;  Laterality: N/A;    FAMILY HISTORY: family history includes Breast cancer in his maternal grandmother and mother; Heart disease in his daughter and mother; Prostate cancer in his father and paternal uncle.  SOCIAL HISTORY:  reports that he has never smoked. He has never used smokeless tobacco. He reports current  alcohol use. He reports that he does not use drugs.  ALLERGIES: Patient has no known allergies.  MEDICATIONS:  Current Outpatient Medications  Medication Sig Dispense Refill  . cholecalciferol (VITAMIN D) 1000 UNITS tablet Take 1,000 Units by mouth daily.     Marland Kitchen losartan-hydrochlorothiazide (HYZAAR) 100-25 MG tablet Take 1 tablet by mouth daily.     No current facility-administered medications for this encounter.    REVIEW OF SYSTEMS:  On review of systems, the patient reports that he is doing well overall. He denies any chest pain, shortness of breath, cough, fevers, chills, night sweats, unintended weight changes. He denies any bowel disturbances, and denies abdominal pain, nausea or vomiting. He denies any new musculoskeletal or joint aches or pains. His IPSS was 8, indicating mild urinary symptoms. His SHIM was 22, indicating he does not have erectile dysfunction. A complete review of systems is obtained and is otherwise negative.   PHYSICAL EXAM:  Wt Readings from Last 3 Encounters:  04/08/20 294 lb 6.4 oz (133.5 kg)  02/07/19 284 lb (128.8 kg)  08/04/15 262 lb (118.8 kg)   Temp Readings from Last 3 Encounters:  04/08/20 97.6 F (36.4 C)  08/04/15 98.1 F (36.7 C) (Oral)  07/06/12 97.9 F (36.6 C) (Oral)   BP Readings from Last 3 Encounters:  04/08/20 (!) 146/94  05/24/19 113/89  02/07/19 (!) 147/91   Pulse Readings from Last 3 Encounters:  04/08/20 77  02/07/19 89  08/04/15 (!) 51   Pain Assessment Pain Score: 0-No pain/10  In general this is a well appearing Caucasian male in no acute distress. He is alert and oriented x4 and appropriate throughout the examination. HEENT reveals that the patient is normocephalic, atraumatic. EOMs are intact. PERRLA. Skin is intact without any evidence of gross lesions. Cardiopulmonary assessment is negative for acute distress and he exhibits normal effort. The abdomen is soft, non tender, non distended. Lower extremities are negative  for pretibial pitting edema, deep calf tenderness, cyanosis or clubbing.  KPS = 100  100 - Normal; no complaints; no evidence of disease. 90   - Able to carry on normal activity; minor signs or symptoms of disease. 80   - Normal activity with effort; some signs or symptoms of disease. 23   - Cares for self; unable to carry on normal activity or to do active work. 60   - Requires occasional assistance, but is able to care for most of his personal needs. 50   - Requires considerable assistance and frequent medical care. 50   - Disabled; requires special care and assistance. 28   - Severely disabled; hospital admission is indicated although death not imminent. 68   - Very sick; hospital admission necessary; active supportive treatment necessary. 10   - Moribund; fatal processes progressing rapidly. 0     - Dead  Karnofsky DA, Abelmann WH, Craver LS and [facility] [phone]) The use of the nitrogen mustards in the palliative treatment of carcinoma: with particular reference to bronchogenic carcinoma Cancer 1 634-56   LABORATORY DATA:  Lab Results  Component Value Date  WBC 4.2 07/03/2012   HGB 13.3 07/03/2012   HCT 37.8 (L) 07/03/2012   MCV 91.3 07/03/2012   PLT 148 (L) 07/03/2012   Lab Results  Component Value Date   NA 141 07/03/2012   K 3.8 07/03/2012   CL 104 07/03/2012   CO2 31 07/03/2012   Lab Results  Component Value Date   ALT 43 06/25/2012   AST 29 06/25/2012   ALKPHOS 67 06/25/2012   BILITOT 0.6 06/25/2012     RADIOGRAPHY: No results found.    IMPRESSION/PLAN: 59 y.o. gentleman with Stage T2a adenocarcinoma of the prostate with a Gleason score of 3+3 and a PSA of 4.07.    We discussed the patient's workup and outlined the nature of prostate cancer in this setting. The patient's T stage, Gleason's score, and PSA put him into the low risk group although there is some concern based on prostate MRI that suggests likely extraprostatic extension. Accordingly, he is  eligible for a variety of potential treatment options including active surveillance, brachytherapy, 5.5 weeks of external radiation, or prostatectomy. Consensus recommendation at time of discussion in conference this morning is to proceed with treatment as opposed to active surveillance. We discussed the available radiation techniques, and focused on the details and logistics of delivery.  We discussed and outlined the risks, benefits, short and long-term effects associated with radiotherapy and compared and contrasted these with prostatectomy. We discussed the role of SpaceOAR gel in reducing the rectal toxicity associated with radiotherapy. He appears to have a good understanding of his disease and our treatment recommendations which are of curative intent.  He and his wife were encouraged to ask questions that were answered to their stated satisfaction.  At the end of the conversation the patient remains undecided but appears to be leaning towards proceeding with treatment with brachytherapy.  He is going to give further consideration of his treatment options and plans to reach a decision in the near future.  He has our contact information should he have any additional questions or concerns related to radiotherapy.  We will plan to follow-up with him in the next 2 weeks, if we have not heard from him first, to assess his readiness to proceed with treatment.  Should he elect to proceed with radiotherapy, we will proceed with treatment planning accordingly at that time.  We enjoyed meeting him and his wife today and are more than happy to continue to participate in his care.     Nicholos Johns, PA-C    Tyler Pita, MD  [facility] Direct Dial: [phone]  Fax: [phone] [email or web address]  Skype  [email or web address]   This document serves as a record of services personally performed by Tyler Pita, MD and Freeman Caldron, PA-C. It was created on their behalf by Wilburn Mylar, a trained medical scribe. The creation of this record is based on the scribe's personal observations and the provider's statements to them. This document has been checked and approved by the attending provider.

## 2020-04-08 NOTE — Consult Note (Signed)
[facility]     04/08/2020   --------------------------------------------------------------------------------   Tony [name]  MRN: [id number]  DOB: Oct 10, 1960, 59 year old Male  SSN:    PRIMARY CARE:    REFERRING:  [name], MD  PROVIDER:  Raynelle Bring, M.D.  LOCATION:  [facility] [phone]     --------------------------------------------------------------------------------   CC/HPI: CC: Prostate Cancer   PCP: Dr. [name]  Location of consult: [facility]   Tony [name] is a 59 year old gentleman with a history of atrial fibrillation, hypertension, and sleep apnea who initially presented to me with a PSA of 5.25 in December 2020. This was repeated and decreased to 3.7. He has a family history of prostate cancer with both his father (treated with brachytherapy) and uncle having had prostate cancer. I offered him a biopsy but he chose to continue to monitor it. His PSA was 4.07 in August 2021 and he was noted to also have some slight induration at the right apex. He agreed to undergo an MRI of the prostate. This was performed in September 2021 and demonstrated two lesions including a 2.1 cm PI RADS 4 lesion of the right anterior prostate and a 1.6 cm PI RADS 3 lesion of the right posterior apex. He proceeded with an MR/US fusion biopsy on 03/14/20 that demonstrated Gleason 3+3=6 adenocarcinoma with 12 out of 20 biopsy cores positive including all 8 targeted biopsy cores. There was a question of EPE with focal capsular bulge at the right apex.   Family history: Father, uncle.   Imaging studies:  MRI (02/05/20): No SVI, LAD, or bone lesions. Question of EPE at right apex.   PMH: He has a history of hypertension, sleep apnea, and h/o atrial fibrillation.  PSH: Appendectomy, hernia repair.   TNM stage: cT2a N0 Mx  PSA: 4.07  Gleason score: 3+3=6 (GG 1)  Biopsy (03/14/20): 12/20 cores  positive  Left: L apex (5%, 3+3=6)  Right: R apex (5%, 3+3=6), R lateral apex (10%, 3+3=6), R mid (10%, 3+3=6)  Targeted: ROI 1 - 4/4 cores (20%, 10%, 10%, 5% - 3+3=6), ROI 2- 4/4 cores (50%, 40%, 10%, 5% - 3+3=6)  Prostate volume: 60.8 cc  PSAD: 0.07   Nomogram  OC disease: 60%  EPE: 35%  SVI: 2%  LNI: 2%  PFS (5 year, 10 year): 93%, 87%   Urinary function: IPSS is 8.  Erectile function: SHIM score is 22.     ALLERGIES: No Known Drug Allergies    MEDICATIONS: Diazepam 10 mg tablet Take 10 mg 30-60 minutes prior to your procedure  Levofloxacin 750 mg tablet Please take one tablet the morning of your biopsy.  Losartan-Hydrochlorothiazide  Vitamin D     GU PSH: Prostate Needle Biopsy - 03/14/2020       PSH Notes: PVI-cath-A FIB   NON-GU PSH: Appendectomy Hernia Repair Surgical Pathology, Gross And Microscopic Examination For Prostate Needle - 03/14/2020     GU PMH: Elevated PSA - 03/14/2020, - 06/13/2019      PMH Notes:   1) Elevated PSA: He presented to me in January 2020. He has a strong family history of prostate cancer with his father and paternal uncle having had prostate cancer. His father was diagnosed in his 52s and treated with brachytherapy and had significant complications including incontinence. His father and uncle have both lived into their 71s. His PSA increased to 5.25 in December 2020 but was 3.73 when rechecked  in January 2021 in our office. I offered him a biopsy at that time due his PSA level and family history. He declined and elected to monitor his PSA initially.   Sep 2021: MRI with PIRADS 4 and PIRADS 3 lesion and concern for EPE  Oct 2021: MR/US fusion biopsy -      NON-GU PMH: Atrial Fibrillation Hypertension Sleep Apnea    FAMILY HISTORY: Prostate Cancer - Uncle, Father   SOCIAL HISTORY: Marital Status: Married Preferred Language: English; Ethnicity: Not Hispanic Or Latino; Race: White Current Smoking Status: Patient has never  smoked.   Tobacco Use Assessment Completed: Used Tobacco in last 30 days? Social Drinker.  Drinks 3 caffeinated drinks per day.    REVIEW OF SYSTEMS:    GU Review Male:   Patient denies frequent urination, hard to postpone urination, burning/ pain with urination, get up at night to urinate, leakage of urine, stream starts and stops, trouble starting your streams, and have to strain to urinate .  Gastrointestinal (Upper):   Patient denies nausea and vomiting.  Gastrointestinal (Lower):   Patient denies diarrhea and constipation.  Constitutional:   Patient denies fever, night sweats, weight loss, and fatigue.  Skin:   Patient denies skin rash/ lesion and itching.  Eyes:   Patient denies blurred vision and double vision.  Ears/ Nose/ Throat:   Patient denies sore throat and sinus problems.  Hematologic/Lymphatic:   Patient denies swollen glands and easy bruising.  Cardiovascular:   Patient denies chest pains and leg swelling.  Respiratory:   Patient denies cough and shortness of breath.  Endocrine:   Patient denies excessive thirst.  Musculoskeletal:   Patient denies back pain and joint pain.  Neurological:   Patient denies headaches and dizziness.  Psychologic:   Patient denies depression and anxiety.   VITAL SIGNS: None   MULTI-SYSTEM PHYSICAL EXAMINATION:    Constitutional: Well-nourished. No physical deformities. Normally developed. Good grooming.     Complexity of Data:  Lab Test Review:   PSA  Records Review:   Pathology Reports, Previous Patient Records  X-Ray Review: MRI Prostate GSORAD: Reviewed Films.     12/12/19 06/13/19  PSA  Total PSA 4.07 ng/mL 3.73 ng/mL    PROCEDURES: None   ASSESSMENT:      ICD-10 Details  1 GU:   Prostate Cancer - C61    PLAN:           Document Letter(s):  Created for Patient: Clinical Summary         Notes:   1. Prostate cancer: I had a detailed conversation with Tony [name] and his wife today regarding his prostate cancer situation.  He is very well informed through his prior discussions today with Dr. Alen Blew and Dr. Tammi Klippel. The patient was counseled about the natural history of prostate cancer and the standard treatment options that are available for prostate cancer. It was explained to him how his age and life expectancy, clinical stage, Gleason score, and PSA affect his prognosis, the decision to proceed with additional staging studies, as well as how that information influences recommended treatment strategies. We discussed the roles for active surveillance, radiation therapy, surgical therapy, androgen deprivation, as well as ablative therapy options for the treatment of prostate cancer as appropriate to his individual cancer situation. We discussed the risks and benefits of these options with regard to their impact on cancer control and also in terms of potential adverse events, complications, and impact on quality of life particularly related to urinary  and sexual function. The patient was encouraged to ask questions throughout the discussion today and all questions were answered to his stated satisfaction. In addition, the patient was provided with and/or directed to appropriate resources and literature for further education about prostate cancer and treatment options.   Although he does have low-grade prostate cancer, there is concern based on his rectal exam findings, volume of disease, and MRI that suggests extraprostatic extension. As such, I was hesitant to strongly recommend active surveillance although we did review this option in detail understanding the potentially increased risk associated with this option. We also reviewed therapy of curative intent in detail. He is going to consider his options and appears to be leaning toward radiation therapy.   Cc: Dr. [name]  Dr. Zola Button  Dr. Tyler Pita             Next Appointment:      Next Appointment: 04/08/2020 01:00 PM    Appointment Type: Block Time     Location: [facility] [phone]    Provider: Raynelle Bring, M.D.    Reason for Visit: [place]: We spent 46 minutes dedicated to evaluation and management time, including face to face interaction, discussions on coordination of care, documentation, result review, and discussion with others as applicable.

## 2020-04-08 NOTE — Progress Notes (Signed)
                               Care Plan Summary  Name: Tony [name]  DOB: 1960/07/01   Your Medical Team:   Urologist -  Dr. Raynelle Bring, [facility]  Radiation Oncologist - Dr. Tyler Pita, [facility]   Medical Oncologist - Dr. Zola Button, [place]  Recommendations: 1) External beam radiation   * These recommendations are based on information available as of today's consult.      Recommendations may change depending on the results of further tests or exams.  Next Steps: 1) Consider your options. [name], RN will follow up in one week.    When appointments need to be scheduled, you will be contacted by [facility] and/or [facility].  Questions?  Please do not hesitate to call [name], RN, BSN, OCN at [phone] any questions or concerns.  [name] is your Oncology Nurse Navigator and is available to assist you while you're receiving your medical care at [facility].

## 2020-04-08 NOTE — Progress Notes (Signed)
[place] Psychosocial Distress Screening Spiritual Care  Met with "Tony [name]" and his wife [name] in [facility], accompanied by Counseling Intern Gaylyn Rong, to introduce Support Center team/resources, reviewing distress screen per protocol.  The patient scored a 5 on the Psychosocial Distress Thermometer which indicates moderate distress. Also assessed for distress and other psychosocial needs.   ONCBCN DISTRESS SCREENING 04/08/2020  Screening Type Initial Screening  Distress experienced in past week (1-10) 5  Practical problem type Work/school  Emotional problem type Nervousness/Anxiety  Referral to support programs Yes   [name] used this encounter to process questions of meaning, discernment about whether to share with family or to keep private, and other personal reflections, engaging deeply in his questions. Provided empathic listening, pastoral reflection, spiritual/emotional support, and additional questions for discernment.   Follow up needed: Yes.  [name] welcomes a follow-up call from [place] for further reflective conversation. Plan to phone him next week.   [place], [place], [facility] Pager [phone] Voicemail [phone]

## 2020-04-09 DIAGNOSIS — C61 Malignant neoplasm of prostate: Secondary | ICD-10-CM | POA: Insufficient documentation

## 2020-04-15 ENCOUNTER — Encounter: Payer: Self-pay | Admitting: Medical Oncology

## 2020-04-16 ENCOUNTER — Encounter: Payer: Self-pay | Admitting: General Practice

## 2020-04-16 NOTE — Progress Notes (Signed)
[facility] Spiritual Care Note  Received and returned voicemail from Tony [name], encouraging return call.   [place], [place], [facility] Pager [phone] Voicemail [phone]

## 2020-04-17 ENCOUNTER — Encounter: Payer: Self-pay | Admitting: General Practice

## 2020-04-17 NOTE — Progress Notes (Signed)
[facility] Spiritual Care Note  Received and returned voicemail again.    [place], [place], [facility] Pager [phone] Voicemail [phone]

## 2020-04-18 ENCOUNTER — Encounter: Payer: Self-pay | Admitting: General Practice

## 2020-04-18 NOTE — Progress Notes (Signed)
[place] Note  Connected with Jed by phone to schedule a Spiritual Care follow-up conversation on Thursday, December 9 at 4:00pm.   Pauline Good, [facility] Pager [phone] Voicemail [phone]

## 2020-04-24 ENCOUNTER — Encounter: Payer: Self-pay | Admitting: General Practice

## 2020-04-24 NOTE — Progress Notes (Signed)
[facility] Spiritual Care Note  Met with Tony [name] in my office for spiritual direction and emotional support. Provided empathic listening, pastoral reflection, and affirmation of strengths as he shared and processed feelings related to diagnosis and treatment plan. We set a follow-up appointment for 4pm on Tuesday 12/28.   [place], [place], [facility] Pager [phone] Voicemail [phone]

## 2020-05-13 ENCOUNTER — Encounter: Payer: Self-pay | Admitting: General Practice

## 2020-05-13 NOTE — Progress Notes (Signed)
[place] Spiritual Care Note  Met with Tony [name] for hour-long Spiritual Care appointment in my office. He used the opportunity well to share and process his adjustment to diagnosis and discernment re treatment plan. Provided empathic listening, pastoral reflection, and affirmation of strengths as he does this reflective work. We set up a six-month check-in for 6/28 at 4pm, and he plans to phone as needed/desired in the meantime.    [place], [place], [facility] Pager [phone] Voicemail [phone]

## 2020-05-18 ENCOUNTER — Telehealth: Payer: Self-pay

## 2020-05-18 NOTE — Telephone Encounter (Signed)
Pt requesting to receive monoclonal antibody infusion. Pt sx started today. Home testing confirmed. Pt with prostate cancer.  Advised pt that infusion supply is low and are currently being used for most at risk patients. Pt verbalized understanding.  Called and LM on infusion VM.

## 2020-05-20 ENCOUNTER — Other Ambulatory Visit (HOSPITAL_COMMUNITY): Payer: Self-pay | Admitting: Unknown Physician Specialty

## 2020-05-20 ENCOUNTER — Telehealth (HOSPITAL_COMMUNITY): Payer: Self-pay | Admitting: Pharmacist

## 2020-05-20 ENCOUNTER — Telehealth: Payer: Self-pay | Admitting: Unknown Physician Specialty

## 2020-05-20 ENCOUNTER — Encounter: Payer: Self-pay | Admitting: Unknown Physician Specialty

## 2020-05-20 MED ORDER — NIRMATRELVIR/RITONAVIR (PAXLOVID)TABLET: 3.0000 | ORAL_TABLET | Freq: Two times a day (BID) | ORAL | 0 refills | Status: AC

## 2020-05-20 NOTE — Telephone Encounter (Signed)
Patient was prescribed oral covid treatment paxlovid and treatment note was reviewed. Medication has been received by [facility]:[id number]] and reviewed for appropriateness.  Drug Interactions or Dosage Adjustments Noted: No drug interactions were identified and no dosage adjustment needed.  Delivery Method: pick up  Patient contacted for counseling on 05/20/2020 and verbalized understanding.   Delivery or Pick-Up Date: 05/20/2020   Rita Ohara 05/20/2020, 6:05 PM [facility] Outpatient Pharmacist Phone# [phone]

## 2020-05-20 NOTE — Telephone Encounter (Signed)
Outpatient Oral COVID Treatment Note  I connected with Tony [name] on 05/20/2020/4:20 PM by telephone and verified that I am speaking with the correct person using two identifiers.  I discussed the limitations, risks, security, and privacy concerns of performing an evaluation and management service by telephone and the availability of in person appointments. I also discussed with the patient that there may be a patient responsible charge related to this service. The patient expressed understanding and agreed to proceed.  Patient location: home Provider location: home  Diagnosis: COVID-19 infection  Purpose of visit: Discussion of potential use of Molnupiravir or Paxlovid, a new treatment for mild to moderate COVID-19 viral infection in non-hospitalized patients.   Subjective: Patient is a 60 y.o. male who has been diagnosed with COVID 19 viral infection.  Their symptoms began on 1/2 with SOB, HA, low grade fever pulse ox 95-96    Past Medical History:  Diagnosis Date  . A-fib [facility]) 2008   ablation done  . Hemorrhoid thrombosis 2012   self -resolved  . Hypertension   . Prostate cancer (HCC)   . Recurrent ventral periumbilical incisional hernia s/p repair 07/06/2012 03/16/2011  . Sleep apnea    pt does not know settings   . Ventral hernia     No Known Allergies  Current Outpatient Medications on File Prior to Visit  Medication Sig Dispense Refill  . cholecalciferol (VITAMIN D) 1000 UNITS tablet Take 1,000 Units by mouth daily.     Marland Kitchen losartan-hydrochlorothiazide (HYZAAR) 100-25 MG tablet Take 1 tablet by mouth daily.     No current facility-administered medications on file prior to visit.    Objective: Patient appears/sounds in no distress.  They are in no apparent distress.  Breathing is non labored.  Mood and behavior are normal.  Laboratory Data:  No results found for this or any previous visit (from the past 2160 hour(s)).   Assessment: 60 y.o. male with mild/moderate COVID  19 viral infection diagnosed on 1/3 at high risk for progression to severe COVID 19.  Plan:  This patient is a 60 y.o. male that meets the following criteria for Emergency Use Authorization of: Paxlovid 1. Age >12 yr AND > 40 kg 2. SARS-COV-2 positive test 3. Symptom onset < 5 days 4. Mild-to-moderate COVID disease with high risk for severe progression to hospitalization or death  I have spoken and communicated the following to the patient or parent/caregiver regarding: 1. Paxlovid is an unapproved drug that is authorized for use under an Emergency Use Authorization.  2. There are no adequate, approved, available products for the treatment of COVID-19 in adults who have mild-to-moderate COVID-19 and are at high risk for progressing to severe COVID-19, including hospitalization or death. 3. Other therapeutics are currently authorized. For additional information on all products authorized for treatment or prevention of COVID-19, please see [email or web address].graham-[email or web address]/.  4. There are benefits and risks of taking this treatment as outlined in the "Fact Sheet for Patients and Caregivers."  5. "Fact Sheet for Patients and Caregivers" was reviewed with patient. A hard copy will be provided to patient from pharmacy prior to the patient receiving treatment. 6. Patients should continue to self-isolate and use infection control measures (e.g., wear mask, isolate, social distance, avoid sharing personal items, clean and disinfect "high touch" surfaces, and frequent handwashing) according to CDC guidelines.  7. The patient or parent/caregiver has the option to accept or refuse treatment. 8. Patient medication history was reviewed for potential drug interactions:No drug interactions 9. Patient's  creatinine clearance was calculated to be 1.2 (labs from 12/29 read by patient with creat 1.2 and cr clr calculated to be 97  , and they were therefore prescribed Normal dose (CrCl>60) - nirmatrelvir 150mg  tab (2 tablet) by mouth twice daily AND ritonavir 100mg  tab (1 tablet) by mouth twice daily   After reviewing above information with the patient, the patient agrees to receive Paxlovid.  Follow up instructions:    . Take prescription BID x 5 days as directed . Reach out to pharmacist for counseling on medication if desired . For concerns regarding further COVID symptoms please follow up with your PCP or urgent care . For urgent or life-threatening issues, seek care at your local emergency department  The patient was provided an opportunity to ask questions, and all were answered. The patient agreed with the plan and demonstrated an understanding of the instructions.   Script sent to [facility] and opted to pick up RX.  The patient was advised to call their PCP or seek an in-person evaluation if the symptoms worsen or if the condition fails to improve as anticipated.   I provided 20 minutes of non face-to-face telephone visit time during this encounter, and > 50% was spent counseling as documented under my assessment & plan.  Kathrine Haddock, NP 05/20/2020 /4:20 PM

## 2020-06-09 ENCOUNTER — Encounter: Payer: Self-pay | Admitting: Medical Oncology

## 2020-06-09 NOTE — Progress Notes (Signed)
Patient called stating he is considering external beam. He states he is in a much  better place mentally accepting the diagnosis and working towards treatment. He has enjoyed  talking with Lattie Haw and a personal friend who has offered support. He requested the link to the radiation video to review again. He is going to make a follow up appointment with Dr. Alinda Money to further discuss treatment and then make his final decision. I forwarded the link for the video and asked him to call me with questions or concerns. He voiced understanding.

## 2020-06-09 NOTE — Progress Notes (Signed)
Spoke with patient as follow up to [facility]. He states he is very appreciative of the clinic and found it to be most helpful. He is interested in following up with Lorrin Jackson. He has her contact information and will reach out to her. He is going to consider his options and give them thought. He is not ready to make a treatment decision at this time. I asked him to call me with questions or concerns. He voiced understanding.

## 2020-08-14 ENCOUNTER — Telehealth: Payer: Self-pay | Admitting: Radiation Oncology

## 2020-08-14 NOTE — Telephone Encounter (Signed)
Phoned patient as requested by Freeman Caldron, PA-C. Patient explains he originally consulted with Dr. Tammi Klippel and Ailene Ards but "life deferred me from making a final decision about treatment." He explains he saw Dr. Alinda Money one week ago and has decided he does not want surgery. He verbalized that he expected to see Dr. Tammi Klippel during his follow up consult on Friday but saw it was scheduled with [name]. The patient wants to ensure that [name] will be equipped to answer his clinical questions reference big beam or seeds since "this is a life altering decision." Patient goes onto say, "I just don't want anyone's time wasted." Patient understands this RN will verbalize his concerns to [place] and phone him back with direction (ie.keep current appt or reschedule with Dr. Tammi Klippel.)

## 2020-08-14 NOTE — Progress Notes (Signed)
Radiation Oncology         [phone] ________________________________  Outpatient Re-Consultation  Name: Tony [name] MRN: [id number]  Date: 08/15/2020  DOB: Jan 01, 1961  CC:Tony Hatchet, MD  Tony Bring, MD   REFERRING PHYSICIAN: Raynelle Bring, MD  DIAGNOSIS: 60 y.o. 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 Malignant neoplasm of prostate ([place])  Tony [name] is a 60 y.o. gentleman.  He was noted to have an elevated PSA of 5.25 in 05/2019 by his primary care physician, Dr. Ardeth [name].  Accordingly, he was referred for evaluation in urology by Dr. Alinda [name] on 06/13/19,  digital rectal examination was performed at that time revealing no nodules. A repeat PSA performed that day had decreased to 3.73. Therefore, the patient elected to continue to monitor the PSA closely. A repeat PSA in 11/2019 was elevated for his age at 4.07, therefore, he proceeded to prostate MRI on 02/05/20 for further evaluation.  This scan showed a 2 cm ill-defined PI-RADS 4 lesion in the apex involving the anterior transitional zone bilaterally as well as the right anterior peripheral zone and anterior fibromuscular stroma  Additionally, there was a 1.6 cm PI-RADS 3 lesion in the peripheral zone nodule at the right medial and lateral apex with focal capsular bulge at the site of nodularity in the right anterior and posterolateral apexes, suspicious for mild extracapsular extension. There was no evidence of pelvic lymphadenopathy or bone metastases. The prostate volume was estimated to be 63.4 mL.   PIRADS 4 lesion- anterior and extending into pelvic floor   PIRADS 3 lesion    The patient proceeded to MR fusion transrectal ultrasound with 12 biopsies of the prostate on 03/14/20.  The prostate volume measured 60.8 cc by ultrasound.  Out of 20 core biopsies, 12 were positive.  The maximum Gleason score was  3+3, and this was seen in all 4 samples of each ROI MRI lesions, as well as in the left apex (small focus), right mid, right apex (small focus), and right apex lateral.    We initially met the patient at the multidisciplinary prostate cancer clinic on 04/08/20. At that time, he was undecided regarding how to proceed with treatment. He saw Dr. Alinda [name] for follow up on 08/05/20. They again discussed his case and treatment options. At the end of their conversation, the patient remained undecided but appeared to be leaning towards a form of radiation treatment. A repeat PSA performed that same day was further elevated at 6.93.  He has kindly been referred back to [place] today to review potential radiation treatment options.    PREVIOUS RADIATION THERAPY: No  PAST MEDICAL HISTORY:  has a past medical history of A-fib ([place]) (2008), Hemorrhoid thrombosis (2012), Hypertension, Prostate cancer ([place]), Recurrent ventral periumbilical incisional hernia s/p repair 07/06/2012 (03/16/2011), Sleep apnea, and Ventral hernia.    PAST SURGICAL HISTORY: Past Surgical History:  Procedure Laterality Date  . ABLATION SAPHENOUS VEIN W/ RFA  2008  . APPENDECTOMY  Jan 2010   lap w Primary UHR  . CARDIOVERSION    . COLONOSCOPY WITH PROPOFOL N/A 08/04/2015   Procedure: COLONOSCOPY WITH PROPOFOL;  Surgeon: Garlan Fair, MD;  Location: [facility] END[facility];  Service: Endoscopy;  Laterality: N/A;  . [place]  . HERNIA REPAIR  2010   Primary umb hernia w appy  .  INSERTION OF MESH N/A 07/06/2012   Procedure: INSERTION OF MESH;  Surgeon: Adin Hector, MD;  Location: [facility] ORS;  Service: General;  Laterality: N/A;  . PROSTATE BIOPSY    . VENTRAL HERNIA REPAIR N/A 07/06/2012   Procedure: LAPAROSCOPIC VENTRAL HERNIA;  Surgeon: Adin Hector, MD;  Location: [facility] ORS;  Service: General;  Laterality: N/A;    FAMILY HISTORY: family history includes Breast cancer in his maternal grandmother and mother; Heart disease in  his daughter and mother; Prostate cancer in his father and paternal uncle.  SOCIAL HISTORY:  reports that he has never smoked. He has never used smokeless tobacco. He reports current alcohol use. He reports that he does not use drugs.  ALLERGIES: Patient has no known allergies.  MEDICATIONS:  Current Outpatient Medications  Medication Sig Dispense Refill  . losartan-hydrochlorothiazide (HYZAAR) 100-25 MG tablet Take 1 tablet by mouth daily.    . cholecalciferol (VITAMIN D) 1000 UNITS tablet Take 1,000 Units by mouth daily.  (Patient not taking: Reported on 08/15/2020)     No current facility-administered medications for this encounter.    REVIEW OF SYSTEMS:  On review of systems, the patient reports that he is doing well overall. He denies any chest pain, shortness of breath, cough, fevers, chills, night sweats, unintended weight changes. He denies any bowel disturbances, and denies abdominal pain, nausea or vomiting. He denies any new musculoskeletal or joint aches or pains. His IPSS was 5, indicating mild urinary symptoms. His SHIM was 22, indicating he does not have erectile dysfunction. A complete review of systems is obtained and is otherwise negative.   PHYSICAL EXAM:  Wt Readings from Last 3 Encounters:  08/15/20 296 lb 6 oz (134.4 kg)  04/08/20 294 lb 6.4 oz (133.5 kg)  02/07/19 284 lb (128.8 kg)   Temp Readings from Last 3 Encounters:  08/15/20 (!) 96.8 F (36 C) (Temporal)  04/08/20 97.6 F (36.4 C)  08/04/15 98.1 F (36.7 C) (Oral)   BP Readings from Last 3 Encounters:  08/15/20 (!) 146/90  04/08/20 (!) 146/94  05/24/19 113/89   Pulse Readings from Last 3 Encounters:  08/15/20 82  04/08/20 77  02/07/19 89   Pain Assessment Pain Score: 0-No pain/10  In general this is a well appearing Caucasian male in no acute distress. He is alert and oriented x4 and appropriate throughout the examination. HEENT reveals that the patient is normocephalic, atraumatic. EOMs are  intact. PERRLA. Skin is intact without any evidence of gross lesions. Cardiopulmonary assessment is negative for acute distress and he exhibits normal effort. The abdomen is soft, non tender, non distended. Lower extremities are negative for pretibial pitting edema, deep calf tenderness, cyanosis or clubbing.  KPS = 100  100 - Normal; no complaints; no evidence of disease. 90   - Able to carry on normal activity; minor signs or symptoms of disease. 80   - Normal activity with effort; some signs or symptoms of disease. 34   - Cares for self; unable to carry on normal activity or to do active work. 60   - Requires occasional assistance, but is able to care for most of his personal needs. 50   - Requires considerable assistance and frequent medical care. 25   - Disabled; requires special care and assistance. 55   - Severely disabled; hospital admission is indicated although death not imminent. 10   - Very sick; hospital admission necessary; active supportive treatment necessary. 10   - Moribund; fatal processes progressing rapidly.  0     - Dead  Karnofsky DA, Abelmann [place], Craver LS and Burchenal [facility] [phone]) The use of the nitrogen mustards in the palliative treatment of carcinoma: with particular reference to bronchogenic carcinoma Cancer 1 634-56   LABORATORY DATA:  Lab Results  Component Value Date   WBC 4.2 07/03/2012   HGB 13.3 07/03/2012   HCT 37.8 (L) 07/03/2012   MCV 91.3 07/03/2012   PLT 148 (L) 07/03/2012   Lab Results  Component Value Date   NA 141 07/03/2012   K 3.8 07/03/2012   CL 104 07/03/2012   CO2 31 07/03/2012   Lab Results  Component Value Date   ALT 43 06/25/2012   AST 29 06/25/2012   ALKPHOS 67 06/25/2012   BILITOT 0.6 06/25/2012     RADIOGRAPHY: No results found.    IMPRESSION/PLAN: 60 y.o. 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    We discussed the patient's workup and outlined the nature of  prostate cancer in this setting. The patient's T stage, Gleason's score, and PSA put him into the low risk group although there is some concern based on prostate MRI that suggests likely extraprostatic extension at the right apex. Accordingly, he is eligible for a variety of potential treatment options including active surveillance, brachytherapy, 5.5 weeks of external radiation, or prostatectomy. Consensus recommendation at time of discussion in multidisciplinary conference was to proceed with treatment as opposed to active surveillance and this remains our recommendation. We discussed the available radiation techniques, and focused on the details and logistics of delivery.  We discussed and outlined the risks, benefits, short and long-term effects associated with radiotherapy and compared and contrasted these with prostatectomy. We discussed the role of SpaceOAR gel in reducing the rectal toxicity associated with radiotherapy. He appears to have a good understanding of his disease and our treatment recommendations which are of curative intent.  He was encouraged to ask questions that were answered to his stated satisfaction.  At the end of the conversation, the patient is interested in moving forward with 5.5 weeks of external beam radiotherapy. We will share our discussion with Dr. Alinda [name] and make arrangements for fiducial markers and SpaceOAR gel placement, prior to simulation, to reduce rectal toxicity from radiotherapy. The patient appears to have a good understanding of his disease and our treatment recommendations which are of curative intent and is in agreement with the stated plan.  Therefore, we will move forward with treatment planning accordingly, in anticipation of beginning IMRT in the near future.    Nicholos Johns, PA-C    Tyler Pita, MD  [facility] Direct Dial: [phone]  Fax: [phone] [email or web address]  Skype  [email or web address]   This document serves as a  record of services personally performed by Tyler Pita, MD and Freeman Caldron, PA-C. It was created on their behalf by Wilburn Mylar, a trained medical scribe. The creation of this record is based on the scribe's personal observations and the provider's statements to them. This document has been checked and approved by the attending provider.

## 2020-08-14 NOTE — Telephone Encounter (Signed)
-----   Message from Freeman Caldron, [place] sent at 08/13/2020 12:20 PM EDT ----- Regarding: FW: PHONE CALL Sam, Will you please try to run interference on this for me to see if you are able to answer his questions.  If he has questions that you are not sure about, I am happy to give him a call back but it looks like he is scheduled for a follow-up new visit with [place] tomorrow to further discuss treatment options. Thank you! -Ash ----- Message ----- From: Kerri Perches Sent: 08/13/2020  11:29 AM EDT To: Freeman Caldron, PA-C Subject: PHONE CALL                                     Hi [name],   This patient called and is wanting you to return his call.  Tony [name] phone number is [phone].  Thanks,  [employer]

## 2020-08-14 NOTE — Telephone Encounter (Signed)
Circled back with patient as promised. Read following message received from Dr. Tammi Klippel to the patient:  Tony [name], We put these follow-up visits on [name]'s schedule to keep my schedule from getting too hectic. But, I do end up seeing these patients with [name] as long as I am in the clinic and not in the OR. Tomorrow, I will be available and plan to participate in the visit. This is generally true for all 'Follow-Up to New" or "Re-Eval" appointments. This is not true for the standard 15-min follow-ups. Sometimes patients feel like they are getting short changed by seeing a PA instead of an MD, but, they quickly realize that the quality and attention to detail from [name] is actually a bonus and makes for a better visit. MM      Patient verbalized understanding of what was verbalized. Patient wants to stress he means no disrespect to [name]. Patient states, "I am glad Dr. Tammi Klippel will be involved since he will be the one making the judgement call and creating the plan." Attempted to reassure the patient of the excellent care we plan to provide. He verbalized understanding and appreciation for the call back. Patient plans to keep appointment tomorrow as is.

## 2020-08-15 ENCOUNTER — Ambulatory Visit
Admission: RE | Admit: 2020-08-15 | Discharge: 2020-08-15 | Disposition: A | Discharge status: Home / Self Care | Payer: BC Managed Care – PPO | Source: Ambulatory Visit | Attending: Urology | Admitting: Urology

## 2020-08-15 ENCOUNTER — Ambulatory Visit
Admission: RE | Admit: 2020-08-15 | Discharge: 2020-08-15 | Disposition: A | Discharge status: Home / Self Care | Payer: BC Managed Care – PPO | Source: Ambulatory Visit | Attending: Radiation Oncology | Admitting: Radiation Oncology

## 2020-08-15 ENCOUNTER — Other Ambulatory Visit: Payer: Self-pay

## 2020-08-15 ENCOUNTER — Encounter: Payer: Self-pay | Admitting: Urology

## 2020-08-15 VITALS — BP 146/90 | HR 82 | Temp 96.8°F | Resp 18 | Ht 76.0 in | Wt 296.4 lb

## 2020-08-15 DIAGNOSIS — Z8042 Family history of malignant neoplasm of prostate: Secondary | ICD-10-CM | POA: Insufficient documentation

## 2020-08-15 DIAGNOSIS — I10 Essential (primary) hypertension: Secondary | ICD-10-CM | POA: Diagnosis not present

## 2020-08-15 DIAGNOSIS — I4891 Unspecified atrial fibrillation: Secondary | ICD-10-CM | POA: Diagnosis not present

## 2020-08-15 DIAGNOSIS — G4730 Sleep apnea, unspecified: Secondary | ICD-10-CM | POA: Diagnosis not present

## 2020-08-15 DIAGNOSIS — Z803 Family history of malignant neoplasm of breast: Secondary | ICD-10-CM | POA: Diagnosis not present

## 2020-08-15 DIAGNOSIS — C61 Malignant neoplasm of prostate: Secondary | ICD-10-CM

## 2020-08-15 DIAGNOSIS — Z79899 Other long term (current) drug therapy: Secondary | ICD-10-CM | POA: Insufficient documentation

## 2020-08-15 DIAGNOSIS — K439 Ventral hernia without obstruction or gangrene: Secondary | ICD-10-CM | POA: Insufficient documentation

## 2020-09-01 ENCOUNTER — Encounter: Payer: Self-pay | Admitting: Medical Oncology

## 2020-09-04 ENCOUNTER — Encounter: Payer: Self-pay | Admitting: Medical Oncology

## 2020-09-08 ENCOUNTER — Telehealth: Payer: Self-pay | Admitting: *Deleted

## 2020-09-08 ENCOUNTER — Encounter: Payer: Self-pay | Admitting: Medical Oncology

## 2020-09-08 NOTE — Progress Notes (Signed)
Spoke with pt to follow up with appointments for gold markers and CT simulation. Gold markers will be place @ [facility] 4/28 and CT simulation 5/6.  He is very concerned that Dr. Alinda Money will not be the one to place his markers. I discussed with him how the patients are scheduled and the purpose of the markers. I informed him he can be rescheduled with Dr. Alinda Money but it may be a delay in treatment. He has asked Dr. Alinda Money to call him tonight to discuss. I will continue to follow and asked him to call me with questions or concerns.

## 2020-09-08 NOTE — Progress Notes (Signed)
Spoke with patient  4/21 to inform him Dr. Alinda Money, is out of the office this week. When he returns Monday, he will submit orders to his surgery schedulers for placement of gold markers. I informed him Enid Derry will contact him appointments but I will follow up with him on Monday.

## 2020-09-08 NOTE — Progress Notes (Signed)
Patient called asking about appointments for gold markers and CT simulation. He had follow up consult with [name], PA and Dr. Tammi Klippel 4/01 and has decided to move forward with radiation. I will follow up with Enid Derry and get back to him.

## 2020-09-08 NOTE — Telephone Encounter (Signed)
RETURNED PATIENT'S PHONE CALL, LVM FOR A RETURN CALL 

## 2020-09-08 NOTE — Progress Notes (Signed)
Informed patient that I have followed up with Enid Derry and with Dr. Lynne Logan office regarding appointment for gold markers. Enid Derry has not been contacted with an appointment and I sent Dr. Alinda Money an e-mail to follow up. As soon as I hear from Dr Alinda Money, I will call him. He voiced understanding.

## 2020-09-09 DIAGNOSIS — S83241A Other tear of medial meniscus, current injury, right knee, initial encounter: Secondary | ICD-10-CM | POA: Diagnosis not present

## 2020-09-11 ENCOUNTER — Telehealth: Payer: Self-pay | Admitting: *Deleted

## 2020-09-11 NOTE — Telephone Encounter (Signed)
CALLED PATIENT TO INFORM OF NEW DATE FOR SIM ON 10/03/20- ARRIVAL TIME- 9:15 AM, SPOKE WITH PATIENT AND HE IS AWARE OF THIS APPT.

## 2020-09-19 ENCOUNTER — Ambulatory Visit: Payer: BC Managed Care – PPO | Admitting: Radiation Oncology

## 2020-09-30 DIAGNOSIS — C61 Malignant neoplasm of prostate: Secondary | ICD-10-CM | POA: Diagnosis not present

## 2020-10-02 ENCOUNTER — Telehealth: Payer: Self-pay | Admitting: *Deleted

## 2020-10-02 NOTE — Telephone Encounter (Signed)
Called patient to remind of sim appt. for 10-03-20- arrival time- 9:15 am, spoke with patient and he is aware of this appt.

## 2020-10-03 ENCOUNTER — Ambulatory Visit
Admission: RE | Admit: 2020-10-03 | Discharge: 2020-10-03 | Disposition: A | Discharge status: Home / Self Care | Payer: BC Managed Care – PPO | Source: Ambulatory Visit | Attending: Radiation Oncology | Admitting: Radiation Oncology

## 2020-10-03 ENCOUNTER — Other Ambulatory Visit: Payer: Self-pay

## 2020-10-03 ENCOUNTER — Encounter: Payer: Self-pay | Admitting: Medical Oncology

## 2020-10-03 DIAGNOSIS — C61 Malignant neoplasm of prostate: Secondary | ICD-10-CM | POA: Insufficient documentation

## 2020-10-03 NOTE — Progress Notes (Signed)
  Radiation Oncology         [phone] ________________________________  Name: Tony [name] MRN: [id number]  Date: 10/03/2020  DOB: [birth year]  SIMULATION AND TREATMENT PLANNING NOTE    ICD-10-CM   1. Malignant neoplasm of prostate ([place])  C61     DIAGNOSIS:  60 y.o. 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  Prostate MRI suggests likely extraprostatic extension at the right apex.  NARRATIVE:  The patient was brought to the [facility].  Identity was confirmed.  All relevant records and images related to the planned course of therapy were reviewed.  The patient freely provided informed written consent to proceed with treatment after reviewing the details related to the planned course of therapy. The consent form was witnessed and verified by the simulation staff.  Then, the patient was set-up in a stable reproducible supine position for radiation therapy.  A vacuum lock pillow device was custom fabricated to position his legs in a reproducible immobilized position.  Then, I performed a urethrogram under sterile conditions to identify the prostatic apex.  CT images were obtained.  Surface markings were placed.  The CT images were loaded into the planning software.  Then the prostate target and avoidance structures including the rectum, bladder, bowel and hips were contoured.  Treatment planning then occurred.  The radiation prescription was entered and confirmed.  A total of one complex treatment devices was fabricated. I have requested : Intensity Modulated Radiotherapy (IMRT) is medically necessary for this case for the following reason:  Rectal sparing.Marland Kitchen  PLAN:  The patient will receive 70 Gy in 28 fractions.  ________________________________  Sheral Apley Tammi Klippel, M.D.

## 2020-10-06 DIAGNOSIS — C61 Malignant neoplasm of prostate: Secondary | ICD-10-CM | POA: Diagnosis not present

## 2020-10-14 DIAGNOSIS — C61 Malignant neoplasm of prostate: Secondary | ICD-10-CM | POA: Diagnosis not present

## 2020-10-14 DIAGNOSIS — R8271 Bacteriuria: Secondary | ICD-10-CM | POA: Diagnosis not present

## 2020-10-14 DIAGNOSIS — R102 Pelvic and perineal pain: Secondary | ICD-10-CM | POA: Diagnosis not present

## 2020-10-14 DIAGNOSIS — N410 Acute prostatitis: Secondary | ICD-10-CM | POA: Diagnosis not present

## 2020-10-14 DIAGNOSIS — Z8546 Personal history of malignant neoplasm of prostate: Secondary | ICD-10-CM | POA: Diagnosis not present

## 2020-10-14 DIAGNOSIS — R600 Localized edema: Secondary | ICD-10-CM | POA: Diagnosis not present

## 2020-10-14 DIAGNOSIS — I700 Atherosclerosis of aorta: Secondary | ICD-10-CM | POA: Diagnosis not present

## 2020-10-15 ENCOUNTER — Ambulatory Visit: Payer: BC Managed Care – PPO

## 2020-10-16 ENCOUNTER — Ambulatory Visit: Payer: BC Managed Care – PPO

## 2020-10-17 ENCOUNTER — Ambulatory Visit: Payer: BC Managed Care – PPO

## 2020-10-20 ENCOUNTER — Ambulatory Visit: Payer: BC Managed Care – PPO

## 2020-10-21 ENCOUNTER — Ambulatory Visit: Payer: BC Managed Care – PPO

## 2020-10-22 ENCOUNTER — Ambulatory Visit: Payer: BC Managed Care – PPO

## 2020-10-23 ENCOUNTER — Encounter: Payer: Self-pay | Admitting: Medical Oncology

## 2020-10-23 ENCOUNTER — Ambulatory Visit: Payer: BC Managed Care – PPO

## 2020-10-23 NOTE — Progress Notes (Signed)
Patient emailed with questions about diet and prostate cancer. I placed information about diet and exercise in the mail to patient.  He will begin radiation 6/15.

## 2020-10-24 ENCOUNTER — Ambulatory Visit: Payer: BC Managed Care – PPO

## 2020-10-27 ENCOUNTER — Ambulatory Visit: Payer: BC Managed Care – PPO

## 2020-10-28 ENCOUNTER — Ambulatory Visit: Payer: BC Managed Care – PPO

## 2020-10-29 ENCOUNTER — Ambulatory Visit
Admission: RE | Admit: 2020-10-29 | Discharge: 2020-10-29 | Disposition: A | Discharge status: Home / Self Care | Payer: BC Managed Care – PPO | Source: Ambulatory Visit | Attending: Radiation Oncology | Admitting: Radiation Oncology

## 2020-10-29 ENCOUNTER — Other Ambulatory Visit: Payer: Self-pay

## 2020-10-29 DIAGNOSIS — C61 Malignant neoplasm of prostate: Secondary | ICD-10-CM | POA: Insufficient documentation

## 2020-10-30 ENCOUNTER — Ambulatory Visit
Admission: RE | Admit: 2020-10-30 | Discharge: 2020-10-30 | Disposition: A | Discharge status: Home / Self Care | Payer: BC Managed Care – PPO | Source: Ambulatory Visit | Attending: Radiation Oncology | Admitting: Radiation Oncology

## 2020-10-30 DIAGNOSIS — C61 Malignant neoplasm of prostate: Secondary | ICD-10-CM | POA: Diagnosis not present

## 2020-10-31 ENCOUNTER — Ambulatory Visit
Admission: RE | Admit: 2020-10-31 | Discharge: 2020-10-31 | Disposition: A | Discharge status: Home / Self Care | Payer: BC Managed Care – PPO | Source: Ambulatory Visit | Attending: Radiation Oncology | Admitting: Radiation Oncology

## 2020-10-31 ENCOUNTER — Other Ambulatory Visit: Payer: Self-pay

## 2020-10-31 DIAGNOSIS — C61 Malignant neoplasm of prostate: Secondary | ICD-10-CM

## 2020-11-03 ENCOUNTER — Ambulatory Visit
Admission: RE | Admit: 2020-11-03 | Discharge: 2020-11-03 | Disposition: A | Discharge status: Home / Self Care | Payer: BC Managed Care – PPO | Source: Ambulatory Visit | Attending: Radiation Oncology | Admitting: Radiation Oncology

## 2020-11-03 ENCOUNTER — Other Ambulatory Visit: Payer: Self-pay

## 2020-11-03 ENCOUNTER — Inpatient Hospital Stay: Payer: BC Managed Care – PPO | Attending: Radiation Oncology | Admitting: Nutrition

## 2020-11-03 DIAGNOSIS — C61 Malignant neoplasm of prostate: Secondary | ICD-10-CM | POA: Diagnosis not present

## 2020-11-03 MED ORDER — HYDRALAZINE HCL 20 MG/ML IJ SOLN
INTRAMUSCULAR | Status: AC
  Filled 2020-11-03: qty 1

## 2020-11-03 NOTE — Progress Notes (Signed)
60 year old male diagnosed with prostate cancer receiving 5-1/2 weeks of radiation therapy.  He is followed by Dr. Tammi Klippel.  Final radiation therapy scheduled for July 26.  Past medical history includes A. fib, hypertension, sleep apnea.  Medications include vitamin D.  Labs were reviewed.  Height: 6 feet 4 inches. Weight: 296 pounds. BMI: 36.08.  Patient reports he received some conflicting/confusing information regarding diet last week.  He feels information was contradictory and he wanted clarification.  States he generally tries to eat a healthy diet.  Reports he had episode of loose stool last Thursday. Denies difficulty eating.  Reports he exercises regularly and that includes cardiovascular training and weight training.  Nutrition diagnosis:  Food and nutrition related knowledge deficit related to prostate cancer and associated treatments as evidenced by no prior need for nutrition related information.  Intervention: Educated on strategies for healthy plant-based foods as long as he was symptom free.  Explained now that he has exhibited diarrhea, low fiber diet is recommended.  Educated on no fried foods.  Limit dairy products to 2 servings daily.  Educated on low lactose options.  Encouraged yogurt daily.  Encouraged adequate fluid intake.  Encouraged soluble forms of fiber and restricting insoluble fiber.  Multiple nutrition facts sheets given.  Questions were answered.  Teach back method used.  Contact information given.  Monitoring, evaluation, goals: Patient will tolerate low fiber diet for minimal loss of lean body mass.  Nutrition diagnosis resolved.  Patient educated to contact RD with further questions or concerns.  **Disclaimer: This note was dictated with voice recognition software. Similar sounding words can inadvertently be transcribed and this note may contain transcription errors which may not have been corrected upon publication of note.**

## 2020-11-04 ENCOUNTER — Ambulatory Visit
Admission: RE | Admit: 2020-11-04 | Discharge: 2020-11-04 | Disposition: A | Discharge status: Home / Self Care | Payer: BC Managed Care – PPO | Source: Ambulatory Visit | Attending: Radiation Oncology | Admitting: Radiation Oncology

## 2020-11-04 DIAGNOSIS — C61 Malignant neoplasm of prostate: Secondary | ICD-10-CM | POA: Diagnosis not present

## 2020-11-05 ENCOUNTER — Other Ambulatory Visit: Payer: Self-pay

## 2020-11-05 ENCOUNTER — Ambulatory Visit
Admission: RE | Admit: 2020-11-05 | Discharge: 2020-11-05 | Disposition: A | Discharge status: Home / Self Care | Payer: BC Managed Care – PPO | Source: Ambulatory Visit | Attending: Radiation Oncology | Admitting: Radiation Oncology

## 2020-11-05 DIAGNOSIS — C61 Malignant neoplasm of prostate: Secondary | ICD-10-CM | POA: Diagnosis not present

## 2020-11-06 ENCOUNTER — Ambulatory Visit
Admission: RE | Admit: 2020-11-06 | Discharge: 2020-11-06 | Disposition: A | Discharge status: Home / Self Care | Payer: BC Managed Care – PPO | Source: Ambulatory Visit | Attending: Radiation Oncology | Admitting: Radiation Oncology

## 2020-11-06 DIAGNOSIS — C61 Malignant neoplasm of prostate: Secondary | ICD-10-CM | POA: Diagnosis not present

## 2020-11-07 ENCOUNTER — Ambulatory Visit
Admission: RE | Admit: 2020-11-07 | Discharge: 2020-11-07 | Disposition: A | Discharge status: Home / Self Care | Payer: BC Managed Care – PPO | Source: Ambulatory Visit | Attending: Radiation Oncology | Admitting: Radiation Oncology

## 2020-11-07 ENCOUNTER — Other Ambulatory Visit: Payer: Self-pay

## 2020-11-07 DIAGNOSIS — C61 Malignant neoplasm of prostate: Secondary | ICD-10-CM | POA: Diagnosis not present

## 2020-11-10 ENCOUNTER — Other Ambulatory Visit: Payer: Self-pay

## 2020-11-10 ENCOUNTER — Ambulatory Visit
Admission: RE | Admit: 2020-11-10 | Discharge: 2020-11-10 | Disposition: A | Discharge status: Home / Self Care | Payer: BC Managed Care – PPO | Source: Ambulatory Visit | Attending: Radiation Oncology | Admitting: Radiation Oncology

## 2020-11-10 DIAGNOSIS — C61 Malignant neoplasm of prostate: Secondary | ICD-10-CM | POA: Diagnosis not present

## 2020-11-11 ENCOUNTER — Encounter: Payer: Self-pay | Admitting: General Practice

## 2020-11-11 ENCOUNTER — Ambulatory Visit
Admission: RE | Admit: 2020-11-11 | Discharge: 2020-11-11 | Disposition: A | Discharge status: Home / Self Care | Payer: BC Managed Care – PPO | Source: Ambulatory Visit | Attending: Radiation Oncology | Admitting: Radiation Oncology

## 2020-11-11 DIAGNOSIS — C61 Malignant neoplasm of prostate: Secondary | ICD-10-CM | POA: Diagnosis not present

## 2020-11-11 NOTE — Progress Notes (Signed)
[facility] Spiritual Care Note  Had follow-up appointment with Jed in my office for pastoral check-in and theological reflection related to diagnosis, treatment, and other life themes. Jed utilizes Spiritual Care to process deep ideas and personal experiences. Provided empathic listening, normalization of feelings, emotional support, and pastoral reflection.  We plan to follow up on August 23 at 4pm for pastoral check-in after he has completed radiation treatments and some recovery.   [place], [place], [facility] Pager [phone] Voicemail [phone]

## 2020-11-12 ENCOUNTER — Ambulatory Visit
Admission: RE | Admit: 2020-11-12 | Discharge: 2020-11-12 | Disposition: A | Discharge status: Home / Self Care | Payer: BC Managed Care – PPO | Source: Ambulatory Visit | Attending: Radiation Oncology | Admitting: Radiation Oncology

## 2020-11-12 ENCOUNTER — Other Ambulatory Visit: Payer: Self-pay

## 2020-11-12 DIAGNOSIS — C61 Malignant neoplasm of prostate: Secondary | ICD-10-CM | POA: Diagnosis not present

## 2020-11-13 ENCOUNTER — Ambulatory Visit: Payer: BC Managed Care – PPO

## 2020-11-13 ENCOUNTER — Ambulatory Visit
Admission: RE | Admit: 2020-11-13 | Discharge: 2020-11-13 | Disposition: A | Discharge status: Home / Self Care | Payer: BC Managed Care – PPO | Source: Ambulatory Visit | Attending: Radiation Oncology | Admitting: Radiation Oncology

## 2020-11-13 DIAGNOSIS — C61 Malignant neoplasm of prostate: Secondary | ICD-10-CM | POA: Diagnosis not present

## 2020-11-14 ENCOUNTER — Other Ambulatory Visit: Payer: Self-pay

## 2020-11-14 ENCOUNTER — Ambulatory Visit
Admission: RE | Admit: 2020-11-14 | Discharge: 2020-11-14 | Disposition: A | Discharge status: Home / Self Care | Payer: BC Managed Care – PPO | Source: Ambulatory Visit | Attending: Radiation Oncology | Admitting: Radiation Oncology

## 2020-11-14 DIAGNOSIS — C61 Malignant neoplasm of prostate: Secondary | ICD-10-CM | POA: Insufficient documentation

## 2020-11-18 ENCOUNTER — Other Ambulatory Visit: Payer: Self-pay

## 2020-11-18 ENCOUNTER — Ambulatory Visit
Admission: RE | Admit: 2020-11-18 | Discharge: 2020-11-18 | Disposition: A | Discharge status: Home / Self Care | Payer: BC Managed Care – PPO | Source: Ambulatory Visit | Attending: Radiation Oncology | Admitting: Radiation Oncology

## 2020-11-18 DIAGNOSIS — C61 Malignant neoplasm of prostate: Secondary | ICD-10-CM | POA: Diagnosis not present

## 2020-11-19 ENCOUNTER — Ambulatory Visit
Admission: RE | Admit: 2020-11-19 | Discharge: 2020-11-19 | Disposition: A | Discharge status: Home / Self Care | Payer: BC Managed Care – PPO | Source: Ambulatory Visit | Attending: Radiation Oncology | Admitting: Radiation Oncology

## 2020-11-19 DIAGNOSIS — C61 Malignant neoplasm of prostate: Secondary | ICD-10-CM | POA: Diagnosis not present

## 2020-11-20 ENCOUNTER — Other Ambulatory Visit: Payer: Self-pay

## 2020-11-20 ENCOUNTER — Ambulatory Visit
Admission: RE | Admit: 2020-11-20 | Discharge: 2020-11-20 | Disposition: A | Discharge status: Home / Self Care | Payer: BC Managed Care – PPO | Source: Ambulatory Visit | Attending: Radiation Oncology | Admitting: Radiation Oncology

## 2020-11-20 DIAGNOSIS — C61 Malignant neoplasm of prostate: Secondary | ICD-10-CM | POA: Diagnosis not present

## 2020-11-21 ENCOUNTER — Ambulatory Visit
Admission: RE | Admit: 2020-11-21 | Discharge: 2020-11-21 | Disposition: A | Discharge status: Home / Self Care | Payer: BC Managed Care – PPO | Source: Ambulatory Visit | Attending: Radiation Oncology | Admitting: Radiation Oncology

## 2020-11-21 DIAGNOSIS — C61 Malignant neoplasm of prostate: Secondary | ICD-10-CM | POA: Diagnosis not present

## 2020-11-24 ENCOUNTER — Other Ambulatory Visit: Payer: Self-pay

## 2020-11-24 ENCOUNTER — Ambulatory Visit: Payer: BC Managed Care – PPO

## 2020-11-24 DIAGNOSIS — C61 Malignant neoplasm of prostate: Secondary | ICD-10-CM | POA: Diagnosis not present

## 2020-11-25 ENCOUNTER — Ambulatory Visit: Payer: BC Managed Care – PPO

## 2020-11-25 DIAGNOSIS — C61 Malignant neoplasm of prostate: Secondary | ICD-10-CM | POA: Diagnosis not present

## 2020-11-26 ENCOUNTER — Other Ambulatory Visit: Payer: Self-pay

## 2020-11-26 ENCOUNTER — Ambulatory Visit
Admission: RE | Admit: 2020-11-26 | Discharge: 2020-11-26 | Disposition: A | Discharge status: Home / Self Care | Payer: BC Managed Care – PPO | Source: Ambulatory Visit | Attending: Radiation Oncology | Admitting: Radiation Oncology

## 2020-11-26 DIAGNOSIS — C61 Malignant neoplasm of prostate: Secondary | ICD-10-CM | POA: Diagnosis not present

## 2020-11-27 ENCOUNTER — Ambulatory Visit
Admission: RE | Admit: 2020-11-27 | Discharge: 2020-11-27 | Disposition: A | Discharge status: Home / Self Care | Payer: BC Managed Care – PPO | Source: Ambulatory Visit | Attending: Radiation Oncology | Admitting: Radiation Oncology

## 2020-11-27 DIAGNOSIS — C61 Malignant neoplasm of prostate: Secondary | ICD-10-CM | POA: Diagnosis not present

## 2020-11-28 ENCOUNTER — Ambulatory Visit
Admission: RE | Admit: 2020-11-28 | Discharge: 2020-11-28 | Disposition: A | Discharge status: Home / Self Care | Payer: BC Managed Care – PPO | Source: Ambulatory Visit | Attending: Radiation Oncology | Admitting: Radiation Oncology

## 2020-11-28 ENCOUNTER — Other Ambulatory Visit: Payer: Self-pay

## 2020-11-28 DIAGNOSIS — C61 Malignant neoplasm of prostate: Secondary | ICD-10-CM | POA: Diagnosis not present

## 2020-12-01 ENCOUNTER — Ambulatory Visit
Admission: RE | Admit: 2020-12-01 | Discharge: 2020-12-01 | Disposition: A | Discharge status: Home / Self Care | Payer: BC Managed Care – PPO | Source: Ambulatory Visit | Attending: Radiation Oncology | Admitting: Radiation Oncology

## 2020-12-01 ENCOUNTER — Other Ambulatory Visit: Payer: Self-pay

## 2020-12-01 DIAGNOSIS — C61 Malignant neoplasm of prostate: Secondary | ICD-10-CM | POA: Diagnosis not present

## 2020-12-02 ENCOUNTER — Ambulatory Visit
Admission: RE | Admit: 2020-12-02 | Discharge: 2020-12-02 | Disposition: A | Discharge status: Home / Self Care | Payer: BC Managed Care – PPO | Source: Ambulatory Visit | Attending: Radiation Oncology | Admitting: Radiation Oncology

## 2020-12-02 DIAGNOSIS — C61 Malignant neoplasm of prostate: Secondary | ICD-10-CM | POA: Diagnosis not present

## 2020-12-03 ENCOUNTER — Other Ambulatory Visit: Payer: Self-pay

## 2020-12-03 ENCOUNTER — Ambulatory Visit
Admission: RE | Admit: 2020-12-03 | Discharge: 2020-12-03 | Disposition: A | Discharge status: Home / Self Care | Payer: BC Managed Care – PPO | Source: Ambulatory Visit | Attending: Radiation Oncology | Admitting: Radiation Oncology

## 2020-12-03 DIAGNOSIS — C61 Malignant neoplasm of prostate: Secondary | ICD-10-CM | POA: Diagnosis not present

## 2020-12-04 ENCOUNTER — Ambulatory Visit
Admission: RE | Admit: 2020-12-04 | Discharge: 2020-12-04 | Disposition: A | Discharge status: Home / Self Care | Payer: BC Managed Care – PPO | Source: Ambulatory Visit | Attending: Radiation Oncology | Admitting: Radiation Oncology

## 2020-12-04 DIAGNOSIS — C61 Malignant neoplasm of prostate: Secondary | ICD-10-CM | POA: Diagnosis not present

## 2020-12-05 ENCOUNTER — Other Ambulatory Visit: Payer: Self-pay

## 2020-12-05 ENCOUNTER — Ambulatory Visit
Admission: RE | Admit: 2020-12-05 | Discharge: 2020-12-05 | Disposition: A | Discharge status: Home / Self Care | Payer: BC Managed Care – PPO | Source: Ambulatory Visit | Attending: Radiation Oncology | Admitting: Radiation Oncology

## 2020-12-05 DIAGNOSIS — C61 Malignant neoplasm of prostate: Secondary | ICD-10-CM | POA: Diagnosis not present

## 2020-12-08 ENCOUNTER — Ambulatory Visit: Payer: BC Managed Care – PPO

## 2020-12-08 ENCOUNTER — Other Ambulatory Visit: Payer: Self-pay

## 2020-12-08 ENCOUNTER — Encounter: Payer: Self-pay | Admitting: Urology

## 2020-12-08 ENCOUNTER — Ambulatory Visit
Admission: RE | Admit: 2020-12-08 | Discharge: 2020-12-08 | Disposition: A | Discharge status: Home / Self Care | Payer: BC Managed Care – PPO | Source: Ambulatory Visit | Attending: Radiation Oncology | Admitting: Radiation Oncology

## 2020-12-08 DIAGNOSIS — C61 Malignant neoplasm of prostate: Secondary | ICD-10-CM | POA: Diagnosis not present

## 2020-12-09 ENCOUNTER — Ambulatory Visit: Payer: BC Managed Care – PPO

## 2020-12-29 DIAGNOSIS — M25511 Pain in right shoulder: Secondary | ICD-10-CM | POA: Diagnosis not present

## 2021-01-05 ENCOUNTER — Encounter: Payer: Self-pay | Admitting: General Practice

## 2021-01-05 DIAGNOSIS — M25611 Stiffness of right shoulder, not elsewhere classified: Secondary | ICD-10-CM | POA: Diagnosis not present

## 2021-01-05 DIAGNOSIS — M25511 Pain in right shoulder: Secondary | ICD-10-CM | POA: Diagnosis not present

## 2021-01-05 NOTE — Progress Notes (Signed)
[facility] Spiritual Care Note  Need to reschedule tomorrow's appointment. Had trouble getting through by phone, so Okey Dupre Gwynn/Admin Asst for [place] left voicemail on my behalf. Will call by next week to reschedule.   [place], [place], [facility] Pager [phone] Voicemail [phone]

## 2021-01-12 ENCOUNTER — Encounter: Payer: Self-pay | Admitting: General Practice

## 2021-01-12 NOTE — Progress Notes (Signed)
[facility] Spiritual Care Note  Caught Mr Arnwine by phone at an inconvenient time. We plan to try again by phone tomorrow to reschedule our appointment.   [place], [place], [facility] Pager [phone] Voicemail [phone]

## 2021-01-13 ENCOUNTER — Encounter: Payer: Self-pay | Admitting: General Practice

## 2021-01-13 NOTE — Progress Notes (Signed)
[facility] Spiritual Care Note  Connected by phone with Mr Berrian to reschedule a recent appointment. We plan to meet in my office on Thursday 9/1 at 4:15 pm.   Suzette Battiest, [place], [facility] Pager [phone] Voicemail [phone]

## 2021-01-14 DIAGNOSIS — M25511 Pain in right shoulder: Secondary | ICD-10-CM | POA: Diagnosis not present

## 2021-01-14 DIAGNOSIS — M25611 Stiffness of right shoulder, not elsewhere classified: Secondary | ICD-10-CM | POA: Diagnosis not present

## 2021-01-14 DIAGNOSIS — L0390 Cellulitis, unspecified: Secondary | ICD-10-CM | POA: Diagnosis not present

## 2021-01-15 HISTORY — PX: ROTATOR CUFF REPAIR: SHX139

## 2021-01-21 ENCOUNTER — Encounter: Payer: Self-pay | Admitting: Urology

## 2021-01-21 ENCOUNTER — Ambulatory Visit: Payer: Self-pay | Admitting: Urology

## 2021-01-21 NOTE — Progress Notes (Signed)
Patient reports mild fatigue, nocturia x1, some urinary stream weakness, and mild constipation; that's resolving.  I-PSS Score of 6 (mild). Meaningful use complete.  Patient notified of 9:00am telephone visit on 01/22/21 and expressed understanding.

## 2021-01-22 ENCOUNTER — Ambulatory Visit
Admission: RE | Admit: 2021-01-22 | Discharge: 2021-01-22 | Disposition: A | Discharge status: Home / Self Care | Payer: BC Managed Care – PPO | Source: Ambulatory Visit | Attending: Radiation Oncology | Admitting: Radiation Oncology

## 2021-01-22 DIAGNOSIS — C61 Malignant neoplasm of prostate: Secondary | ICD-10-CM | POA: Insufficient documentation

## 2021-01-22 DIAGNOSIS — M25611 Stiffness of right shoulder, not elsewhere classified: Secondary | ICD-10-CM | POA: Diagnosis not present

## 2021-01-22 DIAGNOSIS — M25511 Pain in right shoulder: Secondary | ICD-10-CM | POA: Diagnosis not present

## 2021-01-22 NOTE — Progress Notes (Signed)
  Radiation Oncology         [phone] ________________________________  Name: Tony [name] MRN: [id number]  Date: 12/08/2020  DOB: 04-18-1961  End of Treatment Note  Diagnosis:   60 y.o. 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  Prostate MRI suggests likely extraprostatic extension at the right apex.     Indication for treatment:  Curative, Definitive Radiotherapy       Radiation treatment dates:   10/29/20 - 12/08/20  Site/dose:   The prostate was treated to 70 Gy in 28 fractions of 2.5 Gy  Beams/energy:   The patient was treated with IMRT using volumetric arc therapy delivering 6 MV X-rays to clockwise and counterclockwise circumferential arcs with a 90 degree collimator offset to avoid dose scalloping.  Image guidance was performed with daily cone beam CT prior to each fraction to align to gold markers in the prostate and assure proper bladder and rectal fill volumes.  Immobilization was achieved with BodyFix custom mold.  Narrative: The patient tolerated radiation treatment relatively well with some minor urinary irritation and modest fatigue.  He reported increased frequency, urgency, nocturia, dysuria at the start of his stream and a weakened flow of stream all of which were improved using AZO and Flomax as well as ibuprofen as needed.  He denied any abdominal pain or bowel issues but did notice some mild skin irritation in the treatment field.  Plan: The patient has completed radiation treatment. He will return to radiation oncology clinic for routine followup in one month. I advised him to call or return sooner if he has any questions or concerns related to his recovery or treatment. ________________________________  Sheral Apley. Tammi Klippel, M.D.

## 2021-01-22 NOTE — Progress Notes (Signed)
Radiation Oncology         [phone] ________________________________  Name: Tony [name] MRN: [id number]  Date: 01/22/2021  DOB: [birth year]  Post Treatment Note  CC: [name], MD  Raynelle Bring, MD  Diagnosis:   60 y.o. 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  Prostate MRI suggests likely extraprostatic extension at the right apex.  Interval Since Last Radiation:  6.5 weeks  10/29/20 - 12/08/20:  The prostate was treated to 70 Gy in 28 fractions of 2.5 Gy  Narrative: I spoke with the patient to conduct his routine scheduled 1 month follow up visit via telephone to spare the patient unnecessary potential exposure in the healthcare setting during the current COVID-19 pandemic.  The patient was notified in advance and gave permission to proceed with this visit format.  He tolerated radiation treatment relatively well with some minor urinary irritation and modest fatigue.  He reported increased frequency, urgency, nocturia, dysuria at the start of his stream and a weakened flow of stream all of which were improved using AZO and Flomax as well as ibuprofen as needed.  He denied any abdominal pain or bowel issues but did notice some mild skin irritation in the treatment field.                              On review of systems, the patient states that he is doing very well in general.  He has noticed gradual improvement in the LUTS and no longer using AZO or Flomax.  He has some mild persistent weakened flow of stream and hesitancy but in general, feels that he is able to empty his bladder well on voiding.  He specifically denies dysuria, gross hematuria, straining to void, incomplete bladder emptying or incontinence.  He denies any abdominal pain, nausea, vomiting, diarrhea or constipation.  He did have some mild skin irritation in the treatment field but this has resolved at this point.  He has noticed that his energy is gradually  improving as well and overall, he is quite pleased with his progress to date.  ALLERGIES:  has No Known Allergies.  Meds: Current Outpatient Medications  Medication Sig Dispense Refill   cholecalciferol (VITAMIN D) 1000 UNITS tablet Take 1,000 Units by mouth daily.     losartan-hydrochlorothiazide (HYZAAR) 100-25 MG tablet Take 1 tablet by mouth daily.     Nirmatrelvir & Ritonavir 20 x 150 MG & 10 x '100MG'$  TBPK TAKE 3 TABLETS BY MOUTH 2 TIMES DAILY FOR 5 DAYS AS DIRECTED 30 each 0   No current facility-administered medications for this encounter.    Physical Findings:  vitals were not taken for this visit.  Pain Assessment Pain Score: 0-No pain/10 Unable to assess due to telephone follow-up visit format.  Lab Findings: Lab Results  Component Value Date   WBC 4.2 07/03/2012   HGB 13.3 07/03/2012   HCT 37.8 (L) 07/03/2012   MCV 91.3 07/03/2012   PLT 148 (L) 07/03/2012     Radiographic Findings: No results found.  Impression/Plan: 1. 60 y.o. 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  Prostate MRI suggests likely extraprostatic extension at the right apex. He will continue to follow up with urology for ongoing PSA determinations and has an appointment scheduled for repeat labs on 04/08/2021 and will see Dr.  Borden the following week. He understands what to expect with regards to PSA monitoring going forward. I will look forward to following his response to treatment via correspondence with urology, and would be happy to continue to participate in his care if clinically indicated. I talked to the patient about what to expect in the future, including his risk for erectile dysfunction and rectal bleeding. I encouraged him to call or return to the office if he has any questions regarding his previous radiation or possible radiation side effects. He was comfortable with this plan and will follow up as needed.     Nicholos Johns, PA-C

## 2021-01-27 DIAGNOSIS — M25511 Pain in right shoulder: Secondary | ICD-10-CM | POA: Diagnosis not present

## 2021-01-27 DIAGNOSIS — M25611 Stiffness of right shoulder, not elsewhere classified: Secondary | ICD-10-CM | POA: Diagnosis not present

## 2021-01-29 ENCOUNTER — Other Ambulatory Visit: Payer: Self-pay | Admitting: Urology

## 2021-01-29 DIAGNOSIS — M25511 Pain in right shoulder: Secondary | ICD-10-CM | POA: Diagnosis not present

## 2021-01-29 DIAGNOSIS — C61 Malignant neoplasm of prostate: Secondary | ICD-10-CM

## 2021-01-29 DIAGNOSIS — M25611 Stiffness of right shoulder, not elsewhere classified: Secondary | ICD-10-CM | POA: Diagnosis not present

## 2021-02-02 DIAGNOSIS — M75101 Unspecified rotator cuff tear or rupture of right shoulder, not specified as traumatic: Secondary | ICD-10-CM | POA: Diagnosis not present

## 2021-02-03 ENCOUNTER — Encounter: Payer: Self-pay | Admitting: General Practice

## 2021-02-03 NOTE — Progress Notes (Signed)
[facility] Spiritual Care Note  Mr Boffa phoned to reschedule his appointment to Monday 9/26 at 4:15pm.   [address], [place], [facility] Pager [phone] Voicemail [phone]

## 2021-02-09 ENCOUNTER — Encounter: Payer: Self-pay | Admitting: General Practice

## 2021-02-09 NOTE — Progress Notes (Signed)
[facility] Spiritual Care Note  Met with Tony [name] in my office as scheduled for spiritual and emotional support related to goals that have become clarified through and beyond his treatment. He is doing the deep personal work of self-reflection and plans to seek out additional pathways for support on the next steps of this experience. He also plans to reach out again to [place] for a pastoral check-in in the future.   [place], [place], [facility] Pager [phone] Voicemail [phone]

## 2021-02-13 DIAGNOSIS — B349 Viral infection, unspecified: Secondary | ICD-10-CM | POA: Diagnosis not present

## 2021-02-13 DIAGNOSIS — R519 Headache, unspecified: Secondary | ICD-10-CM | POA: Diagnosis not present

## 2021-02-13 DIAGNOSIS — R058 Other specified cough: Secondary | ICD-10-CM | POA: Diagnosis not present

## 2021-02-19 ENCOUNTER — Telehealth: Payer: Self-pay | Admitting: *Deleted

## 2021-03-09 DIAGNOSIS — G4733 Obstructive sleep apnea (adult) (pediatric): Secondary | ICD-10-CM | POA: Diagnosis not present

## 2021-03-10 DIAGNOSIS — G8918 Other acute postprocedural pain: Secondary | ICD-10-CM | POA: Diagnosis not present

## 2021-03-10 DIAGNOSIS — M19011 Primary osteoarthritis, right shoulder: Secondary | ICD-10-CM | POA: Diagnosis not present

## 2021-03-10 DIAGNOSIS — S43431A Superior glenoid labrum lesion of right shoulder, initial encounter: Secondary | ICD-10-CM | POA: Diagnosis not present

## 2021-03-10 DIAGNOSIS — Z4889 Encounter for other specified surgical aftercare: Secondary | ICD-10-CM | POA: Diagnosis not present

## 2021-03-10 DIAGNOSIS — M75121 Complete rotator cuff tear or rupture of right shoulder, not specified as traumatic: Secondary | ICD-10-CM | POA: Diagnosis not present

## 2021-03-10 DIAGNOSIS — M7541 Impingement syndrome of right shoulder: Secondary | ICD-10-CM | POA: Diagnosis not present

## 2021-03-10 DIAGNOSIS — M25511 Pain in right shoulder: Secondary | ICD-10-CM | POA: Diagnosis not present

## 2021-03-10 DIAGNOSIS — M7521 Bicipital tendinitis, right shoulder: Secondary | ICD-10-CM | POA: Diagnosis not present

## 2021-03-10 DIAGNOSIS — M7542 Impingement syndrome of left shoulder: Secondary | ICD-10-CM | POA: Diagnosis not present

## 2021-03-10 DIAGNOSIS — I890 Lymphedema, not elsewhere classified: Secondary | ICD-10-CM | POA: Diagnosis not present

## 2021-03-10 DIAGNOSIS — S46011A Strain of muscle(s) and tendon(s) of the rotator cuff of right shoulder, initial encounter: Secondary | ICD-10-CM | POA: Diagnosis not present

## 2021-03-19 DIAGNOSIS — M25511 Pain in right shoulder: Secondary | ICD-10-CM | POA: Diagnosis not present

## 2021-03-25 DIAGNOSIS — M25511 Pain in right shoulder: Secondary | ICD-10-CM | POA: Diagnosis not present

## 2021-03-31 DIAGNOSIS — G4733 Obstructive sleep apnea (adult) (pediatric): Secondary | ICD-10-CM | POA: Diagnosis not present

## 2021-03-31 DIAGNOSIS — I10 Essential (primary) hypertension: Secondary | ICD-10-CM | POA: Diagnosis not present

## 2021-04-01 DIAGNOSIS — M25511 Pain in right shoulder: Secondary | ICD-10-CM | POA: Diagnosis not present

## 2021-04-07 DIAGNOSIS — M25511 Pain in right shoulder: Secondary | ICD-10-CM | POA: Diagnosis not present

## 2021-04-08 DIAGNOSIS — C61 Malignant neoplasm of prostate: Secondary | ICD-10-CM | POA: Diagnosis not present

## 2021-04-14 DIAGNOSIS — M25511 Pain in right shoulder: Secondary | ICD-10-CM | POA: Diagnosis not present

## 2021-04-16 DIAGNOSIS — M25511 Pain in right shoulder: Secondary | ICD-10-CM | POA: Diagnosis not present

## 2021-04-17 DIAGNOSIS — M25531 Pain in right wrist: Secondary | ICD-10-CM | POA: Diagnosis not present

## 2021-04-17 DIAGNOSIS — C61 Malignant neoplasm of prostate: Secondary | ICD-10-CM | POA: Diagnosis not present

## 2021-04-23 DIAGNOSIS — K760 Fatty (change of) liver, not elsewhere classified: Secondary | ICD-10-CM | POA: Diagnosis not present

## 2021-04-23 DIAGNOSIS — Z125 Encounter for screening for malignant neoplasm of prostate: Secondary | ICD-10-CM | POA: Diagnosis not present

## 2021-04-24 ENCOUNTER — Encounter: Payer: Self-pay | Admitting: *Deleted

## 2021-04-24 ENCOUNTER — Other Ambulatory Visit: Payer: Self-pay

## 2021-04-24 ENCOUNTER — Inpatient Hospital Stay: Payer: BC Managed Care – PPO | Attending: Adult Health | Admitting: *Deleted

## 2021-04-24 DIAGNOSIS — C61 Malignant neoplasm of prostate: Secondary | ICD-10-CM

## 2021-04-24 NOTE — Progress Notes (Signed)
  2 Identifiers used for visit for verification purposes only. SCP reviewed and completed. SDOH assessed and completed.Pt had appt with urologist yesterday. PSA was down to 1.16. He's very pleased with that. Pt has very little urine urgency. He sleeps well and does not get up to restroom at night. Pt stated that usually he empties bladder after second try to restroom. He describes his urine stream as "in-between", not as weak.Denies pain and fatigue. He will visit PCP on 05/05/21. Last colonoscopy was 2017.He does see a dermatologist annually. No flu vaccine received but will talk to PCP next week as well as mention the pneumonia and shingles vaccine. He does exercise 3x weekly with weights, and riding recumbent bicycle.

## 2021-04-29 DIAGNOSIS — M25511 Pain in right shoulder: Secondary | ICD-10-CM | POA: Diagnosis not present

## 2021-04-30 DIAGNOSIS — G4733 Obstructive sleep apnea (adult) (pediatric): Secondary | ICD-10-CM | POA: Diagnosis not present

## 2021-04-30 DIAGNOSIS — I10 Essential (primary) hypertension: Secondary | ICD-10-CM | POA: Diagnosis not present

## 2021-05-05 DIAGNOSIS — Z0000 Encounter for general adult medical examination without abnormal findings: Secondary | ICD-10-CM | POA: Diagnosis not present

## 2021-05-05 DIAGNOSIS — Z1339 Encounter for screening examination for other mental health and behavioral disorders: Secondary | ICD-10-CM | POA: Diagnosis not present

## 2021-05-05 DIAGNOSIS — Z23 Encounter for immunization: Secondary | ICD-10-CM | POA: Diagnosis not present

## 2021-05-05 DIAGNOSIS — S46009A Unspecified injury of muscle(s) and tendon(s) of the rotator cuff of unspecified shoulder, initial encounter: Secondary | ICD-10-CM | POA: Diagnosis not present

## 2021-05-05 DIAGNOSIS — Z1331 Encounter for screening for depression: Secondary | ICD-10-CM | POA: Diagnosis not present

## 2021-05-05 DIAGNOSIS — M25511 Pain in right shoulder: Secondary | ICD-10-CM | POA: Diagnosis not present

## 2021-05-05 DIAGNOSIS — I10 Essential (primary) hypertension: Secondary | ICD-10-CM | POA: Diagnosis not present

## 2021-05-13 DIAGNOSIS — M25511 Pain in right shoulder: Secondary | ICD-10-CM | POA: Diagnosis not present

## 2021-05-19 DIAGNOSIS — M25511 Pain in right shoulder: Secondary | ICD-10-CM | POA: Diagnosis not present

## 2021-05-27 DIAGNOSIS — M25511 Pain in right shoulder: Secondary | ICD-10-CM | POA: Diagnosis not present

## 2021-05-31 DIAGNOSIS — G4733 Obstructive sleep apnea (adult) (pediatric): Secondary | ICD-10-CM | POA: Diagnosis not present

## 2021-05-31 DIAGNOSIS — I10 Essential (primary) hypertension: Secondary | ICD-10-CM | POA: Diagnosis not present

## 2021-07-01 DIAGNOSIS — I10 Essential (primary) hypertension: Secondary | ICD-10-CM | POA: Diagnosis not present

## 2021-07-01 DIAGNOSIS — G4733 Obstructive sleep apnea (adult) (pediatric): Secondary | ICD-10-CM | POA: Diagnosis not present

## 2021-07-20 DIAGNOSIS — G4733 Obstructive sleep apnea (adult) (pediatric): Secondary | ICD-10-CM | POA: Diagnosis not present

## 2021-07-22 DIAGNOSIS — L821 Other seborrheic keratosis: Secondary | ICD-10-CM | POA: Diagnosis not present

## 2021-07-22 DIAGNOSIS — L918 Other hypertrophic disorders of the skin: Secondary | ICD-10-CM | POA: Diagnosis not present

## 2021-07-22 DIAGNOSIS — D225 Melanocytic nevi of trunk: Secondary | ICD-10-CM | POA: Diagnosis not present

## 2021-07-22 DIAGNOSIS — D2271 Melanocytic nevi of right lower limb, including hip: Secondary | ICD-10-CM | POA: Diagnosis not present

## 2021-08-10 DIAGNOSIS — H5213 Myopia, bilateral: Secondary | ICD-10-CM | POA: Diagnosis not present

## 2021-08-10 DIAGNOSIS — H524 Presbyopia: Secondary | ICD-10-CM | POA: Diagnosis not present

## 2021-08-10 DIAGNOSIS — H2513 Age-related nuclear cataract, bilateral: Secondary | ICD-10-CM | POA: Diagnosis not present

## 2021-08-12 DIAGNOSIS — G4733 Obstructive sleep apnea (adult) (pediatric): Secondary | ICD-10-CM | POA: Diagnosis not present

## 2021-10-14 DIAGNOSIS — C61 Malignant neoplasm of prostate: Secondary | ICD-10-CM | POA: Diagnosis not present

## 2021-10-21 DIAGNOSIS — C61 Malignant neoplasm of prostate: Secondary | ICD-10-CM | POA: Diagnosis not present

## 2021-10-21 DIAGNOSIS — R8271 Bacteriuria: Secondary | ICD-10-CM | POA: Diagnosis not present

## 2021-10-29 DIAGNOSIS — Z86010 Personal history of colonic polyps: Secondary | ICD-10-CM | POA: Diagnosis not present

## 2021-10-29 DIAGNOSIS — K649 Unspecified hemorrhoids: Secondary | ICD-10-CM | POA: Diagnosis not present

## 2021-11-09 DIAGNOSIS — Z1339 Encounter for screening examination for other mental health and behavioral disorders: Secondary | ICD-10-CM | POA: Diagnosis not present

## 2021-11-09 DIAGNOSIS — E669 Obesity, unspecified: Secondary | ICD-10-CM | POA: Diagnosis not present

## 2021-12-23 ENCOUNTER — Encounter (INDEPENDENT_AMBULATORY_CARE_PROVIDER_SITE_OTHER): Payer: Self-pay

## 2021-12-23 DIAGNOSIS — Z0289 Encounter for other administrative examinations: Secondary | ICD-10-CM

## 2022-01-01 ENCOUNTER — Telehealth: Payer: Self-pay | Admitting: *Deleted

## 2022-01-01 NOTE — Chronic Care Management (AMB) (Unsigned)
  Care Coordination  Outreach Note  01/01/2022 Name: Tony [name] MRN: [id number] DOB: 1961/01/04   Care Coordination Outreach Attempts  An unsuccessful telephone outreach was attempted today to offer the patient information about available care coordination services as a benefit of their health plan.   Follow Up Plan:  Additional outreach attempts will be made to offer the patient care coordination information and services.   Encounter Outcome:  No Answer   [place]  Direct Dial: [phone]

## 2022-01-05 ENCOUNTER — Encounter (INDEPENDENT_AMBULATORY_CARE_PROVIDER_SITE_OTHER): Payer: Self-pay | Admitting: Family Medicine

## 2022-01-05 ENCOUNTER — Ambulatory Visit (INDEPENDENT_AMBULATORY_CARE_PROVIDER_SITE_OTHER): Payer: BC Managed Care – PPO | Admitting: Family Medicine

## 2022-01-05 VITALS — BP 142/84 | HR 83 | Temp 97.8°F | Ht 76.0 in | Wt 281.0 lb

## 2022-01-05 DIAGNOSIS — Z1331 Encounter for screening for depression: Secondary | ICD-10-CM

## 2022-01-05 DIAGNOSIS — R7309 Other abnormal glucose: Secondary | ICD-10-CM | POA: Diagnosis not present

## 2022-01-05 DIAGNOSIS — R5383 Other fatigue: Secondary | ICD-10-CM | POA: Diagnosis not present

## 2022-01-05 DIAGNOSIS — K760 Fatty (change of) liver, not elsewhere classified: Secondary | ICD-10-CM | POA: Diagnosis not present

## 2022-01-05 DIAGNOSIS — E785 Hyperlipidemia, unspecified: Secondary | ICD-10-CM

## 2022-01-05 DIAGNOSIS — I10 Essential (primary) hypertension: Secondary | ICD-10-CM

## 2022-01-05 DIAGNOSIS — R0602 Shortness of breath: Secondary | ICD-10-CM

## 2022-01-05 DIAGNOSIS — G4733 Obstructive sleep apnea (adult) (pediatric): Secondary | ICD-10-CM | POA: Diagnosis not present

## 2022-01-05 DIAGNOSIS — E669 Obesity, unspecified: Secondary | ICD-10-CM

## 2022-01-05 DIAGNOSIS — Z6834 Body mass index (BMI) 34.0-34.9, adult: Secondary | ICD-10-CM

## 2022-01-05 NOTE — Chronic Care Management (AMB) (Signed)
  Care Coordination  Outreach Note  01/05/2022 Name: LENNART GLADISH MRN: [id number] DOB: Apr 13, 1961   Care Coordination Outreach Attempts  A second unsuccessful outreach was attempted today to offer the patient with information about available care coordination services as a benefit of their health plan.     Follow Up Plan:  Additional outreach attempts will be made to offer the patient care coordination information and services.   Encounter Outcome:  No Answer  [place]  Direct Dial: [phone]

## 2022-01-05 NOTE — Chronic Care Management (AMB) (Signed)
  Care Coordination   Note   01/05/2022 Name: Tony [name] MRN: [id number] DOB: 04-May-1961  [name] is a 61 y.o. year old male who sees [name], MD for primary care. I reached out to Sherle Poe by phone today to offer care coordination services.  [name] was given information about Care Coordination services today including:   The Care Coordination services include support from the care team which includes your Nurse Coordinator, Clinical Social Worker, or Pharmacist.  The Care Coordination team is here to help remove barriers to the health concerns and goals most important to you. Care Coordination services are voluntary, and the patient may decline or stop services at any time by request to their care team member.   Care Coordination Consent Status: Patient did not agree to participate in care coordination services at this time.    Encounter Outcome:  Pt. Refused  [name]  Direct Dial: [phone]

## 2022-01-06 LAB — T4, FREE: Free T4: 1.21 ng/dL (ref 0.82–1.77)

## 2022-01-06 LAB — T3: T3, Total: 93 ng/dL (ref 71–180)

## 2022-01-06 LAB — COMPREHENSIVE METABOLIC PANEL
ALT: 41 IU/L (ref 0–44)
AST: 34 IU/L (ref 0–40)
Albumin/Globulin Ratio: 2.2 (ref 1.2–2.2)
Albumin: 4.6 g/dL (ref 3.9–4.9)
Alkaline Phosphatase: 73 IU/L (ref 44–121)
BUN/Creatinine Ratio: 15 (ref 10–24)
BUN: 15 mg/dL (ref 8–27)
Bilirubin Total: 0.8 mg/dL (ref 0.0–1.2)
CO2: 24 mmol/L (ref 20–29)
Calcium: 9.5 mg/dL (ref 8.6–10.2)
Chloride: 104 mmol/L (ref 96–106)
Creatinine, Ser: 0.97 mg/dL (ref 0.76–1.27)
Globulin, Total: 2.1 g/dL (ref 1.5–4.5)
Glucose: 94 mg/dL (ref 70–99)
Potassium: 3.8 mmol/L (ref 3.5–5.2)
Sodium: 143 mmol/L (ref 134–144)
Total Protein: 6.7 g/dL (ref 6.0–8.5)
eGFR: 88 mL/min/{1.73_m2} (ref >59)

## 2022-01-06 LAB — HEMOGLOBIN A1C
Est. average glucose Bld gHb Est-mCnc: 117 mg/dL
Hgb A1c MFr Bld: 5.7 % — ABNORMAL HIGH (ref 4.8–5.6)

## 2022-01-06 LAB — CBC WITH DIFFERENTIAL/PLATELET
Basophils Absolute: 0 10*3/uL (ref 0.0–0.2)
Basos: 1 %
EOS (ABSOLUTE): 0.1 10*3/uL (ref 0.0–0.4)
Eos: 4 %
Hematocrit: 41.9 % (ref 37.5–51.0)
Hemoglobin: 14.6 g/dL (ref 13.0–17.7)
Immature Grans (Abs): 0 10*3/uL (ref 0.0–0.1)
Immature Granulocytes: 0 %
Lymphocytes Absolute: 1.1 10*3/uL (ref 0.7–3.1)
Lymphs: 29 %
MCH: 32.3 pg (ref 26.6–33.0)
MCHC: 34.8 g/dL (ref 31.5–35.7)
MCV: 93 fL (ref 79–97)
Monocytes Absolute: 0.4 10*3/uL (ref 0.1–0.9)
Monocytes: 11 %
Neutrophils Absolute: 2.1 10*3/uL (ref 1.4–7.0)
Neutrophils: 55 %
Platelets: 142 10*3/uL — ABNORMAL LOW (ref 150–450)
RBC: 4.52 x10E6/uL (ref 4.14–5.80)
RDW: 12.3 % (ref 11.6–15.4)
WBC: 3.7 10*3/uL (ref 3.4–10.8)

## 2022-01-06 LAB — LIPID PANEL WITH LDL/HDL RATIO
Cholesterol, Total: 181 mg/dL (ref 100–199)
HDL: 45 mg/dL (ref >39)
LDL Chol Calc (NIH): 116 mg/dL — ABNORMAL HIGH (ref 0–99)
LDL/HDL Ratio: 2.6 ratio (ref 0.0–3.6)
Triglycerides: 110 mg/dL (ref 0–149)
VLDL Cholesterol Cal: 20 mg/dL (ref 5–40)

## 2022-01-06 LAB — VITAMIN B12: Vitamin B-12: 214 pg/mL — ABNORMAL LOW (ref 232–1245)

## 2022-01-06 LAB — INSULIN, RANDOM: INSULIN: 14.9 u[IU]/mL (ref 2.6–24.9)

## 2022-01-06 LAB — VITAMIN D 25 HYDROXY (VIT D DEFICIENCY, FRACTURES): Vit D, 25-Hydroxy: 39.2 ng/mL (ref 30.0–100.0)

## 2022-01-06 LAB — TSH: TSH: 1.45 u[IU]/mL (ref 0.450–4.500)

## 2022-01-06 LAB — FOLATE: Folate: 8 ng/mL (ref >3.0)

## 2022-01-13 DIAGNOSIS — R300 Dysuria: Secondary | ICD-10-CM | POA: Diagnosis not present

## 2022-01-19 ENCOUNTER — Encounter (INDEPENDENT_AMBULATORY_CARE_PROVIDER_SITE_OTHER): Payer: Self-pay | Admitting: Family Medicine

## 2022-01-19 ENCOUNTER — Ambulatory Visit (INDEPENDENT_AMBULATORY_CARE_PROVIDER_SITE_OTHER): Payer: BC Managed Care – PPO | Admitting: Family Medicine

## 2022-01-19 VITALS — BP 114/73 | HR 66 | Temp 97.6°F | Ht 76.0 in | Wt 267.0 lb

## 2022-01-19 DIAGNOSIS — K760 Fatty (change of) liver, not elsewhere classified: Secondary | ICD-10-CM | POA: Diagnosis not present

## 2022-01-19 DIAGNOSIS — E785 Hyperlipidemia, unspecified: Secondary | ICD-10-CM

## 2022-01-19 DIAGNOSIS — Z6832 Body mass index (BMI) 32.0-32.9, adult: Secondary | ICD-10-CM

## 2022-01-19 DIAGNOSIS — R7303 Prediabetes: Secondary | ICD-10-CM

## 2022-01-19 DIAGNOSIS — E559 Vitamin D deficiency, unspecified: Secondary | ICD-10-CM | POA: Diagnosis not present

## 2022-01-19 DIAGNOSIS — E538 Deficiency of other specified B group vitamins: Secondary | ICD-10-CM | POA: Diagnosis not present

## 2022-01-19 DIAGNOSIS — E669 Obesity, unspecified: Secondary | ICD-10-CM

## 2022-01-19 MED ORDER — VITAMIN D3 125 MCG (5000 UT) PO CAPS: 5000.0000 [IU] | ORAL_CAPSULE | Freq: Every day | ORAL | 0 refills | Status: DC

## 2022-01-19 NOTE — Progress Notes (Signed)
Chief Complaint:   OBESITY Tony [name] (MR# [id number]) is a 61 y.o. male who presents for evaluation and treatment of obesity and related comorbidities. Current BMI is Body mass index is 34.2 kg/m. Tony [name] has been struggling with his weight for many years and has been unsuccessful in either losing weight, maintaining weight loss, or reaching his healthy weight goal.  [name] works as Programme researcher, broadcasting/film/video for [employer]. Works out at 6 am in group fitness class daily. He works [place]. He started a food recall since last appointment. Breakfast-cappuccino with 2% milk, 1-2 scrambled eggs, 1-2 pieces of bacon, english muffin (satisfied). Lunch--[employer] restaurant, salad and fried chicken with honey mustard (felt full and satisfied). Snack--2 ackmacle crackers, matza type cracker with PB 13--4 crackers total. Dinner--[employer] pizza.  Tony [name] is currently in the action stage of change and ready to dedicate time achieving and maintaining a healthier weight. Tony [name] is interested in becoming our patient and working on intensive lifestyle modifications including (but not limited to) diet and exercise for weight loss.  Tony [name]'s habits were reviewed today and are as follows: His family eats meals together, he thinks his family will eat healthier with him, he struggles with family and or coworkers weight loss sabotage, his desired weight loss is 60 pounds, he started gaining weight in 2009, his heaviest weight ever was 290 pounds, he has significant food cravings issues, he snacks frequently in the evenings, he frequently makes poor food choices, he has problems with excessive hunger, he frequently eats larger portions than normal, and he struggles with emotional eating.  Depression Screen [facility]'s Food and Mood (modified PHQ-9) score was 15.     01/05/2022    8:21 AM  Depression screen PHQ 2/9  Decreased Interest 2  Down, Depressed, Hopeless 2  PHQ - 2 Score 4  Altered sleeping 0  Tired, decreased  energy 2  Change in appetite 3  Feeling bad or failure about yourself  1  Trouble concentrating 3  Moving slowly or fidgety/restless 2  Suicidal thoughts 0  PHQ-9 Score 15  Difficult doing work/chores Somewhat difficult   Subjective:   1. Other fatigue [name] reports daytime somnolence and denies waking up still tired. Patient has a history of symptoms of daytime fatigue. [name] generally gets 7 or 8 hours of sleep per night, and states that he has generally restful sleep. Snoring is present. Apneic episodes are present. Epworth Sleepiness Score is 3.    2. SOBOE (shortness of breath on exertion) [name] notes increasing shortness of breath with exercising and seems to be worsening over time with weight gain. He notes getting out of breath sooner with activity than he used to. This has not gotten worse recently. [name] denies shortness of breath at rest or orthopnea.   3. Dyslipidemia LDL 130, HDL 41, Trig 98, Total 191 on 04/23/21. Khi is not on statin.  4. NAFLD (nonalcoholic fatty liver disease) AST 30, ALT 54, AlK Phos 77 (previously higher). CT abd 2010, showed some infiltration of liver.  5. Elevated random blood glucose level Mert's most recent blood sugar was within normal limits but prior, glucose elevated. No A1c or Insulin done recently.  6. OSA (obstructive sleep apnea) Boyd has an CPAP and wears it with 100% compliance. Diagnosed 10-15 years ago.  7. Essential hypertension Clennon's blood pressure slightly elevated today. EKG was within normal limits. He is on medication for --10 years. Taking Losartan HCTZ 100-25 mg.  Assessment/Plan:   1. Other fatigue We  will obtain labs today. Tony [name] does feel that his weight is causing his energy to be lower than it should be. Fatigue may be related to obesity, depression or many other causes. Labs will be ordered, and in the meanwhile, Dariel will focus on self care including making healthy food choices, increasing physical activity  and focusing on stress reduction.    - EKG 12-Lead - Vitamin B12 - Folate - VITAMIN D 25 Hydroxy (Vit-D Deficiency, Fractures) - TSH - T4, free - T3  2. SOBOE (shortness of breath on exertion) Tony [name] does feel that he gets out of breath more easily that he used to when he exercises. Tony [name]'s shortness of breath appears to be obesity related and exercise induced. He has agreed to work on weight loss and gradually increase exercise to treat his exercise induced shortness of breath. Will continue to monitor closely.  - CBC with Differential/Platelet  3. Dyslipidemia We will obtain labs today.  - Lipid Panel With LDL/HDL Ratio  4. NAFLD (nonalcoholic fatty liver disease) We will obtain labs today. CMP.  5. Elevated random blood glucose level We will obtain labs today.  - Hemoglobin A1c - Insulin, random  6. OSA (obstructive sleep apnea) We will follow up on sleep quality at subsequent appointment.  7. Essential hypertension We will obtain labs today. Will follow up on blood pressure at next appointment.  - Comprehensive metabolic panel  8. Depression screening Tony [name] had a positive depression screening. Depression is commonly associated with obesity and often results in emotional eating behaviors. We will monitor this closely and work on CBT to help improve the non-hunger eating patterns. Referral to Psychology may be required if no improvement is seen as he continues in our clinic.   9. Class 1 obesity with serious comorbidity and body mass index (BMI) of 34.0 to 34.9 in adult, unspecified obesity type Tony [name] is currently in the action stage of change and his goal is to continue with weight loss efforts. I recommend Tony [name] begin the structured treatment plan as follows:  He has agreed to the Category 3 Plan+100.  Exercise goals: As is.   Behavioral modification strategies: increasing lean protein intake, decreasing eating out, meal planning and cooking strategies, keeping  healthy foods in the home, and planning for success.  He was informed of the importance of frequent follow-up visits to maximize his success with intensive lifestyle modifications for his multiple health conditions. He was informed we would discuss his lab results at his next visit unless there is a critical issue that needs to be addressed sooner. Hosie agreed to keep his next visit at the agreed upon time to discuss these results.  Objective:   Blood pressure (!) 142/84, pulse 83, temperature 97.8 F (36.6 C), height '6\' 4"'  (1.93 m), weight 281 lb (127.5 kg), SpO2 96 %. Body mass index is 34.2 kg/m.  EKG: Normal sinus rhythm, rate 59 bpm.  Indirect Calorimeter completed today shows a VO2 of 300 ml and a REE of 2074.  His calculated basal metabolic rate is 0174 thus his basal metabolic rate is worse than expected.  General: Cooperative, alert, well developed, in no acute distress. HEENT: Conjunctivae and lids unremarkable. Cardiovascular: Regular rhythm.  Lungs: Normal work of breathing. Neurologic: No focal deficits.   Lab Results  Component Value Date   CREATININE 0.97 01/05/2022   BUN 15 01/05/2022   NA 143 01/05/2022   K 3.8 01/05/2022   CL 104 01/05/2022   CO2 24 01/05/2022   Lab Results  Component Value Date   ALT 41 01/05/2022   AST 34 01/05/2022   ALKPHOS 73 01/05/2022   BILITOT 0.8 01/05/2022   Lab Results  Component Value Date   HGBA1C 5.7 (H) 01/05/2022   Lab Results  Component Value Date   INSULIN 14.9 01/05/2022   Lab Results  Component Value Date   TSH 1.450 01/05/2022   Lab Results  Component Value Date   CHOL 181 01/05/2022   HDL 45 01/05/2022   LDLCALC 116 (H) 01/05/2022   TRIG 110 01/05/2022   Lab Results  Component Value Date   WBC 3.7 01/05/2022   HGB 14.6 01/05/2022   HCT 41.9 01/05/2022   MCV 93 01/05/2022   PLT 142 (L) 01/05/2022   No results found for: "IRON", "TIBC", "FERRITIN"  Attestation Statements:   Reviewed by clinician  on day of visit: allergies, medications, problem list, medical history, surgical history, family history, social history, and previous encounter notes.  Time spent on visit including pre-visit chart review and post-visit charting and care was 60 minutes.   I, Elnora Morrison, RMA am acting as transcriptionist for Coralie Common, MD.  This is the patient's first visit at [facility]. The patient's NEW PATIENT PACKET was reviewed at length. Included in the packet: current and past health history, medications, allergies, ROS, gynecologic history (women only), surgical history, family history, social history, weight history, weight loss surgery history (for those that have had weight loss surgery), nutritional evaluation, mood and food questionnaire, PHQ9, Epworth questionnaire, sleep habits questionnaire, patient life and health improvement goals questionnaire. These will all be scanned into the patient's chart under media.   During the visit, I independently reviewed the patient's EKG, bioimpedance scale results, and indirect calorimeter results. I used this information to tailor a meal plan for the patient that will help him to lose weight and will improve his obesity-related conditions going forward. I performed a medically necessary appropriate examination and/or evaluation. I discussed the assessment and treatment plan with the patient. The patient was provided an opportunity to ask questions and all were answered. The patient agreed with the plan and demonstrated an understanding of the instructions. Labs were ordered at this visit and will be reviewed at the next visit unless more critical results need to be addressed immediately. Clinical information was updated and documented in the EMR.   Time spent on visit including pre-visit chart review and post-visit care was 60 minutes.    I have reviewed the above documentation for accuracy and completeness, and I agree with the above. -  Coralie Common, MD

## 2022-01-21 NOTE — Progress Notes (Signed)
Chief Complaint:   OBESITY Tony [name] is here to discuss his progress with his obesity treatment plan along with follow-up of his obesity related diagnoses. Tony [name] is on the Category 3 Plan and states he is following his eating plan approximately 94% of the time. Tony [name] states he is walking 60 minutes 6-7 times per week.  Today's visit was #: 2 Starting weight: 281 lbs Starting date: 01/05/2022 Today's weight: 267 lbs Today's date: 01/19/2022 Total lbs lost to date: 14 lbs Total lbs lost since last in-office visit: 14  Interim History: Tony [name] liking the satiation of meal plan and ability to then make more conscious decisions. He did keep a log of all food he consumed. Got all 400 calories in for snack. Wondering about how to increase fiber. Always got in all portion of meals. Leaving in 2 days for [place] for 5 days for a fishing trip. Will be in an airport and nutrition information. Walking more frequency.  Subjective:   1. Dyslipidemia LDL 116, HDL 45, Trigly 110. Not on medication. Tony [name] has walked more or been more active daily.  2. Vitamin D deficiency Labs discussed during visit today. Tony [name]'s Vit D level of 39.2. Not on replacement. Notes fatigue.  3. NAFLD (nonalcoholic fatty liver disease) Labs discussed during visit today. Tony [name]'s LFTs previously slightly elevated. LFTs within normal limits at 1st appointment.  4. Vitamin B12 deficiency Labs discussed during visit today. Tony [name]'s Vit B12 slightly decreased at 214. No history of deficiency. Notes fatigue.  5. Prediabetes Labs discussed during visit today. Tony [name]'s A1c 5.7, insulin 14.9. He had 1 episode of feeling hypoglycemia in past 4 years.  Assessment/Plan:   1. Dyslipidemia Will obtain FLP in 3 months. Continue Cat meal plan.  2. Vitamin D deficiency Tony [name] is to take over the counter Vit D 50k IU a day for 1 month with 0 refills.  -Start Cholecalciferol (VITAMIN D3) 125 MCG (5000 UT) CAPS; Take 1 capsule (5,000 Units  total) by mouth daily.  Dispense: 30 capsule; Refill: 0  3. NAFLD (nonalcoholic fatty liver disease) Will obtain CMP in 3 months.  4. Vitamin B12 deficiency Will obtain B12 level in 3 months.  5. Prediabetes Will repeat A1c and Insulin in 3 months. Pathophysiology discussed of IR, Prediabetes, Diabetes today.  6. Obesity with current BMI of 32.6 Tony [name] is currently in the action stage of change. As such, his goal is to continue with weight loss efforts. He has agreed to the Category 3 Plan +100.  Exercise goals: No exercise has been prescribed at this time.  Behavioral modification strategies: increasing lean protein intake, meal planning and cooking strategies, keeping healthy foods in the home, and planning for success.  Tony [name] has agreed to follow-up with our clinic in 3 weeks. He was informed of the importance of frequent follow-up visits to maximize his success with intensive lifestyle modifications for his multiple health conditions.   Objective:   Blood pressure 114/73, pulse 66, temperature 97.6 F (36.4 C), height '6\' 4"'$  (1.93 m), weight 267 lb (121.1 kg), SpO2 98 %. Body mass index is 32.5 kg/m.  General: Cooperative, alert, well developed, in no acute distress. HEENT: Conjunctivae and lids unremarkable. Cardiovascular: Regular rhythm.  Lungs: Normal work of breathing. Neurologic: No focal deficits.   Lab Results  Component Value Date   CREATININE 0.97 01/05/2022   BUN 15 01/05/2022   NA 143 01/05/2022   K 3.8 01/05/2022   CL 104 01/05/2022   CO2 24 01/05/2022   Lab  Results  Component Value Date   ALT 41 01/05/2022   AST 34 01/05/2022   ALKPHOS 73 01/05/2022   BILITOT 0.8 01/05/2022   Lab Results  Component Value Date   HGBA1C 5.7 (H) 01/05/2022   Lab Results  Component Value Date   INSULIN 14.9 01/05/2022   Lab Results  Component Value Date   TSH 1.450 01/05/2022   Lab Results  Component Value Date   CHOL 181 01/05/2022   HDL 45 01/05/2022    LDLCALC 116 (H) 01/05/2022   TRIG 110 01/05/2022   Lab Results  Component Value Date   VD25OH 39.2 01/05/2022   Lab Results  Component Value Date   WBC 3.7 01/05/2022   HGB 14.6 01/05/2022   HCT 41.9 01/05/2022   MCV 93 01/05/2022   PLT 142 (L) 01/05/2022   No results found for: "IRON", "TIBC", "FERRITIN"  Attestation Statements:   Reviewed by clinician on day of visit: allergies, medications, problem list, medical history, surgical history, family history, social history, and previous encounter notes.  Time spent on visit including pre-visit chart review and post-visit care and charting was 50 minutes.   I, Elnora Morrison, RMA am acting as transcriptionist for Coralie Common, MD.  I have reviewed the above documentation for accuracy and completeness, and I agree with the above. - Coralie Common, MD

## 2022-01-26 DIAGNOSIS — G4733 Obstructive sleep apnea (adult) (pediatric): Secondary | ICD-10-CM | POA: Diagnosis not present

## 2022-02-01 DIAGNOSIS — L918 Other hypertrophic disorders of the skin: Secondary | ICD-10-CM | POA: Diagnosis not present

## 2022-02-01 DIAGNOSIS — L738 Other specified follicular disorders: Secondary | ICD-10-CM | POA: Diagnosis not present

## 2022-02-01 DIAGNOSIS — L821 Other seborrheic keratosis: Secondary | ICD-10-CM | POA: Diagnosis not present

## 2022-02-10 DIAGNOSIS — C61 Malignant neoplasm of prostate: Secondary | ICD-10-CM | POA: Diagnosis not present

## 2022-02-10 DIAGNOSIS — R7301 Impaired fasting glucose: Secondary | ICD-10-CM | POA: Diagnosis not present

## 2022-02-10 DIAGNOSIS — Z23 Encounter for immunization: Secondary | ICD-10-CM | POA: Diagnosis not present

## 2022-02-12 DIAGNOSIS — M25551 Pain in right hip: Secondary | ICD-10-CM | POA: Diagnosis not present

## 2022-02-12 DIAGNOSIS — M545 Low back pain, unspecified: Secondary | ICD-10-CM | POA: Diagnosis not present

## 2022-02-22 ENCOUNTER — Ambulatory Visit (INDEPENDENT_AMBULATORY_CARE_PROVIDER_SITE_OTHER): Payer: BC Managed Care – PPO | Admitting: Family Medicine

## 2022-02-22 ENCOUNTER — Encounter (INDEPENDENT_AMBULATORY_CARE_PROVIDER_SITE_OTHER): Payer: Self-pay | Admitting: Family Medicine

## 2022-02-22 VITALS — BP 128/77 | HR 63 | Temp 98.1°F | Ht 76.0 in | Wt 266.0 lb

## 2022-02-22 DIAGNOSIS — R7303 Prediabetes: Secondary | ICD-10-CM | POA: Diagnosis not present

## 2022-02-22 DIAGNOSIS — Z6832 Body mass index (BMI) 32.0-32.9, adult: Secondary | ICD-10-CM | POA: Diagnosis not present

## 2022-02-22 DIAGNOSIS — E669 Obesity, unspecified: Secondary | ICD-10-CM

## 2022-02-22 DIAGNOSIS — I10 Essential (primary) hypertension: Secondary | ICD-10-CM

## 2022-02-22 DIAGNOSIS — Z6834 Body mass index (BMI) 34.0-34.9, adult: Secondary | ICD-10-CM

## 2022-02-24 NOTE — Progress Notes (Signed)
Chief Complaint:   OBESITY Tony [name] is here to discuss his progress with his obesity treatment plan along with follow-up of his obesity related diagnoses. Tony [name] is on the Category 3 Plan +100 and states he is following his eating plan approximately 80% of the time. Tony [name] states he is exercising 90 minutes 7 times per week.  Today's visit was #: 3 Starting weight: 281 lbs Starting date: 01/05/2022 Today's weight: 266 lbs Today's date: 02/22/2022 Total lbs lost to date: 15 lbs Total lbs lost since last in-office visit: 1  Interim History: Tony [name] is struggling with some muscle pain is on muscle relaxant and antiinflammatory. Thinks he is 80% on Cat 3 plan. Trying to be mindful of choices he has made when traveling. Thinks very occasional he is going over snack calories allotment. No upcoming plans to travel. May be interested in other meal plan options.  Subjective:   1. Prediabetes Tony [name] last A1c was 5.3,  just done with Dr. Ardeth Perfect and no longer in prediabetes range.  2. Essential hypertension Blood pressure is well controlled today. Denies chest pain, chest pressure and headache.  Assessment/Plan:   1. Prediabetes Tony [name] is to continue the Category 3 plan.  We will repeat labs in 3-4 months.  No medication is warranted at this time.  2. Essential hypertension Continue current medication without changes in dose.  3. Obesity with current BMI of 32.4 Tony [name] is currently in the action stage of change. As such, his goal is to continue with weight loss efforts. He has agreed to the Category 3 Plan and keeping a food journal and adhering to recommended goals of 250-350/350-500/450-600 calories and 25+/30+/40+ protein.   Exercise goals: All adults should avoid inactivity. Some physical activity is better than none, and adults who participate in any amount of physical activity gain some health benefits.  Behavioral modification strategies: increasing lean protein intake, meal planning  and cooking strategies, keeping healthy foods in the home, and planning for success.  Tony [name] has agreed to follow-up with our clinic in 3 weeks. He was informed of the importance of frequent follow-up visits to maximize his success with intensive lifestyle modifications for his multiple health conditions.   Objective:   Blood pressure 128/77, pulse 63, temperature 98.1 F (36.7 C), height '6\' 4"'$  (1.93 m), weight 266 lb (120.7 kg), SpO2 96 %. Body mass index is 32.38 kg/m.  General: Cooperative, alert, well developed, in no acute distress. HEENT: Conjunctivae and lids unremarkable. Cardiovascular: Regular rhythm.  Lungs: Normal work of breathing. Neurologic: No focal deficits.   Lab Results  Component Value Date   CREATININE 0.97 01/05/2022   BUN 15 01/05/2022   NA 143 01/05/2022   K 3.8 01/05/2022   CL 104 01/05/2022   CO2 24 01/05/2022   Lab Results  Component Value Date   ALT 41 01/05/2022   AST 34 01/05/2022   ALKPHOS 73 01/05/2022   BILITOT 0.8 01/05/2022   Lab Results  Component Value Date   HGBA1C 5.7 (H) 01/05/2022   Lab Results  Component Value Date   INSULIN 14.9 01/05/2022   Lab Results  Component Value Date   TSH 1.450 01/05/2022   Lab Results  Component Value Date   CHOL 181 01/05/2022   HDL 45 01/05/2022   LDLCALC 116 (H) 01/05/2022   TRIG 110 01/05/2022   Lab Results  Component Value Date   VD25OH 39.2 01/05/2022   Lab Results  Component Value Date   WBC 3.7 01/05/2022  HGB 14.6 01/05/2022   HCT 41.9 01/05/2022   MCV 93 01/05/2022   PLT 142 (L) 01/05/2022   No results found for: "IRON", "TIBC", "FERRITIN"  Attestation Statements:   Reviewed by clinician on day of visit: allergies, medications, problem list, medical history, surgical history, family history, social history, and previous encounter notes.  I, Elnora Morrison, RMA am acting as transcriptionist for Coralie Common, MD. I have reviewed the above documentation for accuracy  and completeness, and I agree with the above. - Coralie Common, MD

## 2022-03-09 DIAGNOSIS — G4733 Obstructive sleep apnea (adult) (pediatric): Secondary | ICD-10-CM | POA: Diagnosis not present

## 2022-03-13 DIAGNOSIS — M5459 Other low back pain: Secondary | ICD-10-CM | POA: Diagnosis not present

## 2022-03-17 DIAGNOSIS — M5459 Other low back pain: Secondary | ICD-10-CM | POA: Diagnosis not present

## 2022-03-17 DIAGNOSIS — M25551 Pain in right hip: Secondary | ICD-10-CM | POA: Diagnosis not present

## 2022-03-24 ENCOUNTER — Ambulatory Visit (INDEPENDENT_AMBULATORY_CARE_PROVIDER_SITE_OTHER): Payer: BC Managed Care – PPO | Admitting: Family Medicine

## 2022-03-24 ENCOUNTER — Encounter (INDEPENDENT_AMBULATORY_CARE_PROVIDER_SITE_OTHER): Payer: Self-pay | Admitting: Family Medicine

## 2022-03-24 VITALS — BP 110/70 | HR 68 | Temp 97.5°F | Ht 76.0 in | Wt 262.0 lb

## 2022-03-24 DIAGNOSIS — Z6831 Body mass index (BMI) 31.0-31.9, adult: Secondary | ICD-10-CM | POA: Diagnosis not present

## 2022-03-24 DIAGNOSIS — E669 Obesity, unspecified: Secondary | ICD-10-CM

## 2022-03-24 DIAGNOSIS — I10 Essential (primary) hypertension: Secondary | ICD-10-CM | POA: Diagnosis not present

## 2022-03-31 DIAGNOSIS — M5459 Other low back pain: Secondary | ICD-10-CM | POA: Diagnosis not present

## 2022-03-31 NOTE — Progress Notes (Signed)
Chief Complaint:   OBESITY Tony [name] is here to discuss his progress with his obesity treatment plan along with follow-up of his obesity related diagnoses. Tony [name] is on the Category 3 Plan and states he is following his eating plan approximately 97.5% of the time. Tony [name] states he is spin,lift,hike, swim 60 minutes 1-3 times per week.  Today's visit was #: 4 Starting weight: 281 lbs Starting date: 01/05/2022 Today's weight: 262 lbs Today's date: 03/24/2022 Total lbs lost to date: 19 lbs Total lbs lost since last in-office visit: 4  Interim History: Tony [name] has been very consistent with logging and keeping track of all foods he is eating. Eating eggs every am. Has definitely recognized food trends over time---nuts are increased in calorie and not necessarily increase in protein, ice cream not worth it. Tony [name] is hosting Thanksgiving.  Subjective:   1. Essential hypertension Tony [name] is on Losartan HCTZ. His blood pressure very well controlled today.  Assessment/Plan:   1. Essential hypertension Continue current medication, will consider decrease in medication at next appointment ( only available is 50/12.5 mg).  2. Obesity with current BMI of 31.9 Tony [name] is currently in the action stage of change. As such, his goal is to continue with weight loss efforts. He has agreed to the Category 3 Plan and keeping a food journal and adhering to recommended goals of 1650-1800 calories and 120+ grams of protein daily.   Exercise goals: As is. Tony [name] is going 6:30 am group class daily.  Behavioral modification strategies: increasing lean protein intake, meal planning and cooking strategies, keeping healthy foods in the home, holiday eating strategies , planning for success, and keeping a strict food journal.  Tony [name] has agreed to follow-up with our clinic in 4 weeks. He was informed of the importance of frequent follow-up visits to maximize his success with intensive lifestyle modifications for his multiple  health conditions.   Objective:   Blood pressure 110/70, pulse 68, temperature (!) 97.5 F (36.4 C), height '6\' 4"'$  (1.93 m), weight 262 lb (118.8 kg), SpO2 96 %. Body mass index is 31.89 kg/m.  General: Cooperative, alert, well developed, in no acute distress. HEENT: Conjunctivae and lids unremarkable. Cardiovascular: Regular rhythm.  Lungs: Normal work of breathing. Neurologic: No focal deficits.   Lab Results  Component Value Date   CREATININE 0.97 01/05/2022   BUN 15 01/05/2022   NA 143 01/05/2022   K 3.8 01/05/2022   CL 104 01/05/2022   CO2 24 01/05/2022   Lab Results  Component Value Date   ALT 41 01/05/2022   AST 34 01/05/2022   ALKPHOS 73 01/05/2022   BILITOT 0.8 01/05/2022   Lab Results  Component Value Date   HGBA1C 5.7 (H) 01/05/2022   Lab Results  Component Value Date   INSULIN 14.9 01/05/2022   Lab Results  Component Value Date   TSH 1.450 01/05/2022   Lab Results  Component Value Date   CHOL 181 01/05/2022   HDL 45 01/05/2022   LDLCALC 116 (H) 01/05/2022   TRIG 110 01/05/2022   Lab Results  Component Value Date   VD25OH 39.2 01/05/2022   Lab Results  Component Value Date   WBC 3.7 01/05/2022   HGB 14.6 01/05/2022   HCT 41.9 01/05/2022   MCV 93 01/05/2022   PLT 142 (L) 01/05/2022   No results found for: "IRON", "TIBC", "FERRITIN"  Attestation Statements:   Reviewed by clinician on day of visit: allergies, medications, problem list, medical history, surgical history, family history,  social history, and previous encounter notes.  I, Tony [name], RMA am acting as transcriptionist for Tony Common, MD.  I have reviewed the above documentation for accuracy and completeness, and I agree with the above. - Tony Common, MD

## 2022-04-21 ENCOUNTER — Encounter (INDEPENDENT_AMBULATORY_CARE_PROVIDER_SITE_OTHER): Payer: Self-pay | Admitting: Family Medicine

## 2022-04-21 ENCOUNTER — Ambulatory Visit (INDEPENDENT_AMBULATORY_CARE_PROVIDER_SITE_OTHER): Payer: BC Managed Care – PPO | Admitting: Family Medicine

## 2022-04-21 VITALS — BP 127/85 | HR 63 | Temp 97.8°F | Ht 76.0 in | Wt 262.0 lb

## 2022-04-21 DIAGNOSIS — Z6831 Body mass index (BMI) 31.0-31.9, adult: Secondary | ICD-10-CM | POA: Diagnosis not present

## 2022-04-21 DIAGNOSIS — E669 Obesity, unspecified: Secondary | ICD-10-CM | POA: Diagnosis not present

## 2022-04-21 DIAGNOSIS — I10 Essential (primary) hypertension: Secondary | ICD-10-CM | POA: Diagnosis not present

## 2022-05-05 NOTE — Progress Notes (Signed)
Chief Complaint:   OBESITY Tony [name] is here to discuss his progress with his obesity treatment plan along with follow-up of his obesity related diagnoses. Tony [name] is on keeping a food journal and adhering to recommended goals of 1650-1800 calories and 120+ grams of protein and states he is following his eating plan approximately 71% of the time. Tony [name] states he is spinning/hiking/lifting 45 minutes 6 times per week.  Today's visit was #: 5 Starting weight: 281 lbs Starting date: 01/05/2022 Today's weight: 262 lbs Today's date: 04/21/2022 Total lbs lost to date: 19 lbs Total lbs lost since last in-office visit: 0  Interim History: Tony [name] experienced a Restaurant manager, fast food on [place] no one got injured and house ended up being well. Did have maybe have an extra bourbon. Did not eat to complete satiety. Did not feel he overindulged. Upcoming he has a few opportunities to be indulgent.  Subjective:   1. Essential hypertension Tony [name]'s blood pressure well controlled today. Denies chest pain, chest pressure and headache.  Assessment/Plan:   1. Essential hypertension Continue Hyzaar without change in dose.  2. Obesity with current BMI of 31.9 Tony [name] is currently in the action stage of change. As such, his goal is to continue with weight loss efforts. He has agreed to keeping a food journal and adhering to recommended goals of 1650-1800 calories and 120 grams of protein daily.   Exercise goals: As is. She is meeting with Personal trainer in 2 weeks to set up weight lifting regularly.  Behavioral modification strategies: increasing lean protein intake, meal planning and cooking strategies, keeping healthy foods in the home, holiday eating strategies , celebration eating strategies, planning for success, and keeping a strict food journal.  Tony [name] has agreed to follow-up with our clinic in 4 weeks. He was informed of the importance of frequent follow-up visits to maximize his success with  intensive lifestyle modifications for his multiple health conditions.   Objective:   Blood pressure 127/85, pulse 63, temperature 97.8 F (36.6 C), height '6\' 4"'$  (1.93 m), weight 262 lb (118.8 kg), SpO2 98 %. Body mass index is 31.89 kg/m.  General: Cooperative, alert, well developed, in no acute distress. HEENT: Conjunctivae and lids unremarkable. Cardiovascular: Regular rhythm.  Lungs: Normal work of breathing. Neurologic: No focal deficits.   Lab Results  Component Value Date   CREATININE 0.97 01/05/2022   BUN 15 01/05/2022   NA 143 01/05/2022   K 3.8 01/05/2022   CL 104 01/05/2022   CO2 24 01/05/2022   Lab Results  Component Value Date   ALT 41 01/05/2022   AST 34 01/05/2022   ALKPHOS 73 01/05/2022   BILITOT 0.8 01/05/2022   Lab Results  Component Value Date   HGBA1C 5.7 (H) 01/05/2022   Lab Results  Component Value Date   INSULIN 14.9 01/05/2022   Lab Results  Component Value Date   TSH 1.450 01/05/2022   Lab Results  Component Value Date   CHOL 181 01/05/2022   HDL 45 01/05/2022   LDLCALC 116 (H) 01/05/2022   TRIG 110 01/05/2022   Lab Results  Component Value Date   VD25OH 39.2 01/05/2022   Lab Results  Component Value Date   WBC 3.7 01/05/2022   HGB 14.6 01/05/2022   HCT 41.9 01/05/2022   MCV 93 01/05/2022   PLT 142 (L) 01/05/2022   No results found for: "IRON", "TIBC", "FERRITIN"  Attestation Statements:   Reviewed by clinician on day of visit: allergies, medications, problem list, medical history,  surgical history, family history, social history, and previous encounter notes.  I, Elnora Morrison, RMA am acting as transcriptionist for Coralie Common, MD.  I have reviewed the above documentation for accuracy and completeness, and I agree with the above. - Coralie Common, MD

## 2022-05-19 ENCOUNTER — Ambulatory Visit (INDEPENDENT_AMBULATORY_CARE_PROVIDER_SITE_OTHER): Payer: BC Managed Care – PPO | Admitting: Family Medicine

## 2022-05-19 ENCOUNTER — Encounter (INDEPENDENT_AMBULATORY_CARE_PROVIDER_SITE_OTHER): Payer: Self-pay | Admitting: Family Medicine

## 2022-05-19 VITALS — BP 150/76 | HR 67 | Temp 97.7°F | Ht 76.0 in | Wt 260.0 lb

## 2022-05-19 DIAGNOSIS — I10 Essential (primary) hypertension: Secondary | ICD-10-CM

## 2022-05-19 DIAGNOSIS — E7849 Other hyperlipidemia: Secondary | ICD-10-CM

## 2022-05-19 DIAGNOSIS — R7303 Prediabetes: Secondary | ICD-10-CM

## 2022-05-19 DIAGNOSIS — E669 Obesity, unspecified: Secondary | ICD-10-CM

## 2022-05-19 DIAGNOSIS — R7301 Impaired fasting glucose: Secondary | ICD-10-CM | POA: Diagnosis not present

## 2022-05-19 DIAGNOSIS — Z125 Encounter for screening for malignant neoplasm of prostate: Secondary | ICD-10-CM | POA: Diagnosis not present

## 2022-05-19 DIAGNOSIS — Z6831 Body mass index (BMI) 31.0-31.9, adult: Secondary | ICD-10-CM

## 2022-05-19 DIAGNOSIS — E559 Vitamin D deficiency, unspecified: Secondary | ICD-10-CM

## 2022-05-25 DIAGNOSIS — I10 Essential (primary) hypertension: Secondary | ICD-10-CM | POA: Diagnosis not present

## 2022-05-25 DIAGNOSIS — Z1339 Encounter for screening examination for other mental health and behavioral disorders: Secondary | ICD-10-CM | POA: Diagnosis not present

## 2022-05-25 DIAGNOSIS — Z0000 Encounter for general adult medical examination without abnormal findings: Secondary | ICD-10-CM | POA: Diagnosis not present

## 2022-05-25 DIAGNOSIS — Z1331 Encounter for screening for depression: Secondary | ICD-10-CM | POA: Diagnosis not present

## 2022-05-25 DIAGNOSIS — C61 Malignant neoplasm of prostate: Secondary | ICD-10-CM | POA: Diagnosis not present

## 2022-05-25 DIAGNOSIS — R82998 Other abnormal findings in urine: Secondary | ICD-10-CM | POA: Diagnosis not present

## 2022-05-31 NOTE — Progress Notes (Signed)
Chief Complaint:   OBESITY Jentry is here to discuss his progress with his obesity treatment plan along with follow-up of his obesity related diagnoses. Taggert is on keeping a food journal and adhering to recommended goals of 1650-1800 calories and 120 grams of protein and states he is following his eating plan approximately 87% of the time. Darion states he is spin/core/walking 105 minutes 7 times per week.  Today's visit was #: 6 Starting weight: 281 lbs Starting date: 01/05/2022 Today's weight: 260 lbs Today's date: 05/19/2022 Total lbs lost to date: 21 lbs Total lbs lost since last in-office visit: 2  Interim History: Avrum feels he has done outstanding with his logging.  Questions why he has not lost more.  Has started exercising and is enjoying it so far.  He is going [place] [employer]  Average calories at 1400-1500 and 100-110 g of protein.  Subjective:   1. Prediabetes Last A1c was done by PCP today at 5.3 (Improved from 5.7).  Minimal carb indulgent.  2. Other hyperlipidemia LDL of 116, HDL of 45, trig of 110.  Not on medication.  Labs of LDL and HDL same taody: Trig improved to 72.  Labs done with PCP.  3. Essential hypertension Blood pressure is elevated today. Denies chest pain, chest pressure and headache.  On Hyzaar.  4. Vitamin D deficiency Damin is not taking prescription vitamin D.  Assessment/Plan:   1. Prediabetes Continue journaling; labs in 3-4 months.  2. Other hyperlipidemia Follow-up with labs in 3 months.  3. Essential hypertension Continue current medications.  Follow-up blood pressure at next appointment.  4. Vitamin D deficiency Will repeat vitamin D in 3 to 4 months with no changes in treatment.  5. Obesity with current BMI of 31.7 Kanai is currently in the action stage of change. As such, his goal is to continue with weight loss efforts. He has agreed to keeping a food journal and adhering to recommended  goals of 1650-1800 calories and 120+grams of protein daily.   Exercise goals: All adults should avoid inactivity. Some physical activity is better than none, and adults who participate in any amount of physical activity gain some health benefits.  Behavioral modification strategies: increasing lean protein intake, meal planning and cooking strategies, keeping healthy foods in the home, planning for success, and keeping a strict food journal.  Keevin has agreed to follow-up with our clinic in 4 weeks. He was informed of the importance of frequent follow-up visits to maximize his success with intensive lifestyle modifications for his multiple health conditions.   Objective:   Blood pressure (!) 150/76, pulse 67, temperature 97.7 F (36.5 C), height '6\' 4"'$  (1.93 m), weight 260 lb (117.9 kg), SpO2 98 %. Body mass index is 31.65 kg/m.  General: Cooperative, alert, well developed, in no acute distress. HEENT: Conjunctivae and lids unremarkable. Cardiovascular: Regular rhythm.  Lungs: Normal work of breathing. Neurologic: No focal deficits.   Lab Results  Component Value Date   CREATININE 0.97 01/05/2022   BUN 15 01/05/2022   NA 143 01/05/2022   K 3.8 01/05/2022   CL 104 01/05/2022   CO2 24 01/05/2022   Lab Results  Component Value Date   ALT 41 01/05/2022   AST 34 01/05/2022   ALKPHOS 73 01/05/2022   BILITOT 0.8 01/05/2022   Lab Results  Component Value Date   HGBA1C 5.7 (H) 01/05/2022   Lab Results  Component Value Date   INSULIN  14.9 01/05/2022   Lab Results  Component Value Date   TSH 1.450 01/05/2022   Lab Results  Component Value Date   CHOL 181 01/05/2022   HDL 45 01/05/2022   LDLCALC 116 (H) 01/05/2022   TRIG 110 01/05/2022   Lab Results  Component Value Date   VD25OH 39.2 01/05/2022   Lab Results  Component Value Date   WBC 3.7 01/05/2022   HGB 14.6 01/05/2022   HCT 41.9 01/05/2022   MCV 93 01/05/2022   PLT 142 (L) 01/05/2022   No results found for:  "IRON", "TIBC", "FERRITIN"  Attestation Statements:   Reviewed by clinician on day of visit: allergies, medications, problem list, medical history, surgical history, family history, social history, and previous encounter notes.  Time spent on visit including pre-visit chart review and post-visit care and charting was 30 minutes.   I, Elnora Morrison, RMA am acting as transcriptionist for Coralie Common, MD.  I have reviewed the above documentation for accuracy and completeness, and I agree with the above. - Coralie Common, MD

## 2022-06-01 DIAGNOSIS — C61 Malignant neoplasm of prostate: Secondary | ICD-10-CM | POA: Diagnosis not present

## 2022-06-24 ENCOUNTER — Encounter (INDEPENDENT_AMBULATORY_CARE_PROVIDER_SITE_OTHER): Payer: Self-pay | Admitting: Family Medicine

## 2022-06-24 ENCOUNTER — Other Ambulatory Visit (INDEPENDENT_AMBULATORY_CARE_PROVIDER_SITE_OTHER): Payer: Self-pay | Admitting: Family Medicine

## 2022-06-24 ENCOUNTER — Ambulatory Visit (INDEPENDENT_AMBULATORY_CARE_PROVIDER_SITE_OTHER): Payer: BC Managed Care – PPO | Admitting: Family Medicine

## 2022-06-24 VITALS — BP 118/70 | HR 63 | Temp 97.6°F | Ht 76.0 in | Wt 252.0 lb

## 2022-06-24 DIAGNOSIS — Z6830 Body mass index (BMI) 30.0-30.9, adult: Secondary | ICD-10-CM

## 2022-06-24 DIAGNOSIS — E7849 Other hyperlipidemia: Secondary | ICD-10-CM | POA: Diagnosis not present

## 2022-06-24 DIAGNOSIS — I10 Essential (primary) hypertension: Secondary | ICD-10-CM | POA: Diagnosis not present

## 2022-06-24 DIAGNOSIS — E669 Obesity, unspecified: Secondary | ICD-10-CM | POA: Diagnosis not present

## 2022-06-24 MED ORDER — LOSARTAN POTASSIUM-HCTZ 100-12.5 MG PO TABS: 1.0000 | ORAL_TABLET | Freq: Every day | ORAL | 0 refills | Status: DC

## 2022-06-24 NOTE — Progress Notes (Deleted)
Patient has been exercising more consistently and adding in more resistance training.  He is wondering about increasing his protein intake to 200g a day.  He is wondering if this would be attainable.  Big goal is consistency for the next year. Would like to ultimately gain muscle as a goal. Brought food log in today and has been almost perfectly consistent with logging.

## 2022-07-08 NOTE — Progress Notes (Signed)
Chief Complaint:   OBESITY Tony [name] is here to discuss his progress with his obesity treatment plan along with follow-up of his obesity related diagnoses. Tony [name] is on keeping a food journal and adhering to recommended goals of 1650-1800 calories and 120+ grams of protein and states he is following his eating plan approximately 81% of the time. Tony [name] states he is exercising 90 minutes 7 times per week.  Today's visit was #: 7 Starting weight: 281 lbs Starting date: 01/05/2022 Today's weight: 252 lbs Today's date: 06/24/2022 Total lbs lost to date: 29 lbs Total lbs lost since last in-office visit: 8  Interim History: Tony [name] has been exercising more consistently and adding in more resistance training.  He is wondering about increasing his protein intake to 200g a day.  He is wondering if this would be attainable.  Big goal is consistency for the next year. Would like to ultimately gain muscle as a goal. Brought food log in today and has been almost perfectly consistent with logging.    Subjective:   1. Essential hypertension Denies chest pain, chest pressure and headache.  On 100-25 of Hyzaar.  Carmino reports intermittent lightheadedness.  2. Other hyperlipidemia Jody is not on medications.  LDL of 116, HDL of 45, trig of 119.  Assessment/Plan:   1. Essential hypertension Will Refill/Decrease Hyzaar to 100-12.5 mg by mouth daily for 30 days with 0 refills.  -Refill losartan-hydrochlorothiazide (HYZAAR) 100-12.5 MG tablet; Take 1 tablet by mouth daily.  Dispense: 30 tablet; Refill: 0  2. Other hyperlipidemia Will obtain labs at next appointment.  3. Obesity with current BMI of 30.7 Tony [name] is currently in the action stage of change. As such, his goal is to continue with weight loss efforts. He has agreed to keeping a food journal and adhering to recommended goals of 1650-1800 calories and 150+ grams of protein daily.   Exercise goals: As is.  Discussed at length importance of time  under tension for resistance.  Behavioral modification strategies: increasing lean protein intake, increasing vegetables, meal planning and cooking strategies, keeping healthy foods in the home, planning for success, and keeping a strict food journal.  Shadi has agreed to follow-up with our clinic in 4 weeks. He was informed of the importance of frequent follow-up visits to maximize his success with intensive lifestyle modifications for his multiple health conditions.   Objective:   Blood pressure 118/70, pulse 63, temperature 97.6 F (36.4 C), height '6\' 4"'$  (1.93 m), weight 252 lb (114.3 kg), SpO2 99 %. Body mass index is 30.67 kg/m.  General: Cooperative, alert, well developed, in no acute distress. HEENT: Conjunctivae and lids unremarkable. Cardiovascular: Regular rhythm.  Lungs: Normal work of breathing. Neurologic: No focal deficits.   Lab Results  Component Value Date   CREATININE 0.97 01/05/2022   BUN 15 01/05/2022   NA 143 01/05/2022   K 3.8 01/05/2022   CL 104 01/05/2022   CO2 24 01/05/2022   Lab Results  Component Value Date   ALT 41 01/05/2022   AST 34 01/05/2022   ALKPHOS 73 01/05/2022   BILITOT 0.8 01/05/2022   Lab Results  Component Value Date   HGBA1C 5.7 (H) 01/05/2022   Lab Results  Component Value Date   INSULIN 14.9 01/05/2022   Lab Results  Component Value Date   TSH 1.450 01/05/2022   Lab Results  Component Value Date   CHOL 181 01/05/2022   HDL 45 01/05/2022   LDLCALC 116 (H) 01/05/2022   TRIG 110  01/05/2022   Lab Results  Component Value Date   VD25OH 39.2 01/05/2022   Lab Results  Component Value Date   WBC 3.7 01/05/2022   HGB 14.6 01/05/2022   HCT 41.9 01/05/2022   MCV 93 01/05/2022   PLT 142 (L) 01/05/2022   No results found for: "IRON", "TIBC", "FERRITIN"  Attestation Statements:   Reviewed by clinician on day of visit: allergies, medications, problem list, medical history, surgical history, family history, social  history, and previous encounter notes.  I, Elnora Morrison, RMA am acting as transcriptionist for Coralie Common, MD.  I have reviewed the above documentation for accuracy and completeness, and I agree with the above. - Coralie Common, MD

## 2022-07-18 ENCOUNTER — Encounter (INDEPENDENT_AMBULATORY_CARE_PROVIDER_SITE_OTHER): Payer: Self-pay | Admitting: Family Medicine

## 2022-07-19 NOTE — Telephone Encounter (Signed)
Please advise 

## 2022-07-26 ENCOUNTER — Other Ambulatory Visit (INDEPENDENT_AMBULATORY_CARE_PROVIDER_SITE_OTHER): Payer: Self-pay | Admitting: Adult Health

## 2022-07-26 ENCOUNTER — Encounter (INDEPENDENT_AMBULATORY_CARE_PROVIDER_SITE_OTHER): Payer: Self-pay | Admitting: Adult Health

## 2022-07-26 ENCOUNTER — Ambulatory Visit (INDEPENDENT_AMBULATORY_CARE_PROVIDER_SITE_OTHER): Payer: BC Managed Care – PPO | Admitting: Adult Health

## 2022-07-26 VITALS — BP 113/74 | HR 85 | Temp 98.0°F | Ht 76.0 in | Wt 242.0 lb

## 2022-07-26 DIAGNOSIS — Z6829 Body mass index (BMI) 29.0-29.9, adult: Secondary | ICD-10-CM

## 2022-07-26 DIAGNOSIS — E669 Obesity, unspecified: Secondary | ICD-10-CM | POA: Diagnosis not present

## 2022-07-26 DIAGNOSIS — I10 Essential (primary) hypertension: Secondary | ICD-10-CM | POA: Diagnosis not present

## 2022-07-26 DIAGNOSIS — E559 Vitamin D deficiency, unspecified: Secondary | ICD-10-CM

## 2022-07-26 MED ORDER — LOSARTAN POTASSIUM-HCTZ 50-12.5 MG PO TABS: 1.0000 | ORAL_TABLET | Freq: Every day | ORAL | 0 refills | Status: DC

## 2022-07-26 NOTE — Progress Notes (Signed)
WEIGHT SUMMARY AND BIOMETRICS  Vitals Temp: 33 F (36.7 C) BP: 113/74 Pulse Rate: 85 SpO2: 97 %   Anthropometric Measurements Height: '6\' 4"'$  (1.93 m) Weight: 242 lb (109.8 kg) BMI (Calculated): 29.47 Weight at Last Visit: 252lb Weight Lost Since Last Visit: 10lb Starting Weight: 281lb Total Weight Loss (lbs): 39 lb (17.7 kg)   Body Composition  Body Fat %: 24.1 % Fat Mass (lbs): 58.4 lbs Muscle Mass (lbs): 174.6 lbs Total Body Water (lbs): 120 lbs Visceral Fat Rating : 14   Other Clinical Data Fasting: Yes Labs: no Today's Visit #: 8 Starting Date: 01/05/22    Chief Complaint:   OBESITY Tony [name] is here to discuss his progress with his obesity treatment plan. He is on the keeping a food journal and adhering to recommended goals of 1650-1800 calories and 150 protein and states he is following his eating plan approximately 91 % of the time. He states he is exercising 90 minutes 7 times per week.   Interim History:  Tony [name] will consume between 150g-200g protein per day. He continues to exercise- cardiovascular exercise- 90 everyday!  He endorses intermittent lightheadedness.  Reviewed Bioempedence results with pt: Muscle Mass - 2 lbs Adipose Mass - 8.6 lbs Visceral Adipose Rating 14  Subjective:   1. Vitamin D deficiency He is on OTC Vit D 5000 IU QD  2. Essential hypertension BP trending down and he is experiencing intermittent lightheadedness. He does not check BP at home. He is currently on daily Losartan/HCTZ 100/12.'5mg'$   Assessment/Plan:   1. Vitamin D deficiency Continue current OTC supplementation  2. Essential hypertension Reduce losartan/HCTZ 100/12.5 to 50/12.5 Disp 30 RF 0 Monitor for sx's. Remain well hydrated.  3. Obesity with current BMI of 29.47  Tony [name] is currently in the action stage of change. As such, his goal is to continue with weight loss efforts. He has agreed to keeping a food journal and adhering to recommended goals  of 1650-1800 calories and 150 protein.   Exercise goals: For substantial health benefits, adults should do at least 150 minutes (2 hours and 30 minutes) a week of moderate-intensity, or 75 minutes (1 hour and 15 minutes) a week of vigorous-intensity aerobic physical activity, or an equivalent combination of moderate- and vigorous-intensity aerobic activity. Aerobic activity should be performed in episodes of at least 10 minutes, and preferably, it should be spread throughout the week.  Behavioral modification strategies: increasing lean protein intake, decreasing simple carbohydrates, increasing vegetables, increasing water intake, and planning for success.  Tony [name] has agreed to follow-up with our clinic in 4 weeks. He was informed of the importance of frequent follow-up visits to maximize his success with intensive lifestyle modifications for his multiple health conditions.   Objective:   Blood pressure 113/74, pulse 85, temperature 98 F (36.7 C), height '6\' 4"'$  (1.93 m), weight 242 lb (109.8 kg), SpO2 97 %. Body mass index is 29.46 kg/m.  General: Cooperative, alert, well developed, in no acute distress. HEENT: Conjunctivae and lids unremarkable. Cardiovascular: Regular rhythm.  Lungs: Normal work of breathing. Neurologic: No focal deficits.   Lab Results  Component Value Date   CREATININE 0.97 01/05/2022   BUN 15 01/05/2022   NA 143 01/05/2022   K 3.8 01/05/2022   CL 104 01/05/2022   CO2 24 01/05/2022   Lab Results  Component Value Date   ALT 41 01/05/2022   AST 34 01/05/2022   ALKPHOS 73 01/05/2022   BILITOT 0.8 01/05/2022   Lab Results  Component Value Date   HGBA1C 5.7 (H) 01/05/2022   Lab Results  Component Value Date   INSULIN 14.9 01/05/2022   Lab Results  Component Value Date   TSH 1.450 01/05/2022   Lab Results  Component Value Date   CHOL 181 01/05/2022   HDL 45 01/05/2022   LDLCALC 116 (H) 01/05/2022   TRIG 110 01/05/2022   Lab Results  Component  Value Date   VD25OH 39.2 01/05/2022   Lab Results  Component Value Date   WBC 3.7 01/05/2022   HGB 14.6 01/05/2022   HCT 41.9 01/05/2022   MCV 93 01/05/2022   PLT 142 (L) 01/05/2022   No results found for: "IRON", "TIBC", "FERRITIN"  Attestation Statements:   Reviewed by clinician on day of visit: allergies, medications, problem list, medical history, surgical history, family history, social history, and previous encounter notes.  I have reviewed the above documentation for accuracy and completeness, and I agree with the above. -  Zaina Jenkin d. Breeonna Mone, NP-C

## 2022-08-22 ENCOUNTER — Encounter (INDEPENDENT_AMBULATORY_CARE_PROVIDER_SITE_OTHER): Payer: Self-pay | Admitting: Adult Health

## 2022-08-22 ENCOUNTER — Other Ambulatory Visit (INDEPENDENT_AMBULATORY_CARE_PROVIDER_SITE_OTHER): Payer: Self-pay | Admitting: Adult Health

## 2022-08-23 ENCOUNTER — Encounter (INDEPENDENT_AMBULATORY_CARE_PROVIDER_SITE_OTHER): Payer: Self-pay | Admitting: Adult Health

## 2022-08-23 DIAGNOSIS — H2513 Age-related nuclear cataract, bilateral: Secondary | ICD-10-CM | POA: Diagnosis not present

## 2022-08-23 DIAGNOSIS — H5213 Myopia, bilateral: Secondary | ICD-10-CM | POA: Diagnosis not present

## 2022-08-23 DIAGNOSIS — H52203 Unspecified astigmatism, bilateral: Secondary | ICD-10-CM | POA: Diagnosis not present

## 2022-08-25 ENCOUNTER — Encounter (INDEPENDENT_AMBULATORY_CARE_PROVIDER_SITE_OTHER): Payer: Self-pay | Admitting: Adult Health

## 2022-08-25 ENCOUNTER — Ambulatory Visit (INDEPENDENT_AMBULATORY_CARE_PROVIDER_SITE_OTHER): Payer: BC Managed Care – PPO | Admitting: Adult Health

## 2022-08-25 VITALS — BP 112/74 | HR 77 | Temp 98.1°F | Ht 76.0 in | Wt 233.0 lb

## 2022-08-25 DIAGNOSIS — R7303 Prediabetes: Secondary | ICD-10-CM

## 2022-08-25 DIAGNOSIS — Z6828 Body mass index (BMI) 28.0-28.9, adult: Secondary | ICD-10-CM

## 2022-08-25 DIAGNOSIS — E538 Deficiency of other specified B group vitamins: Secondary | ICD-10-CM

## 2022-08-25 DIAGNOSIS — E559 Vitamin D deficiency, unspecified: Secondary | ICD-10-CM | POA: Diagnosis not present

## 2022-08-25 DIAGNOSIS — E66811 Obesity, class 1: Secondary | ICD-10-CM

## 2022-08-25 DIAGNOSIS — I10 Essential (primary) hypertension: Secondary | ICD-10-CM | POA: Diagnosis not present

## 2022-08-25 DIAGNOSIS — E669 Obesity, unspecified: Secondary | ICD-10-CM

## 2022-08-25 MED ORDER — LOSARTAN POTASSIUM 50 MG PO TABS: 50.0000 mg | ORAL_TABLET | Freq: Every day | ORAL | 0 refills | Status: DC

## 2022-08-25 NOTE — Progress Notes (Signed)
WEIGHT SUMMARY AND BIOMETRICS  Vitals Temp: 98.1 F (36.7 C) BP: 112/74 Pulse Rate: 77 SpO2: 98 %   Anthropometric Measurements Height: 6\' 4"  (1.93 m) Weight: 233 lb (105.7 kg) BMI (Calculated): 28.37 Weight at Last Visit: 242lb Weight Lost Since Last Visit: 9lb Weight Gained Since Last Visit: 0 Starting Weight: 281lb Total Weight Loss (lbs): 48 lb (21.8 kg)   Body Composition  Body Fat %: 22.6 % Fat Mass (lbs): 52.8 lbs Muscle Mass (lbs): 171.8 lbs Total Body Water (lbs): 119.4 lbs Visceral Fat Rating : 13   Other Clinical Data Fasting: Yes Labs: No Today's Visit #: 9 Starting Date: 01/05/22    Chief Complaint:   OBESITY Tony [name] is here to discuss his progress with his obesity treatment plan. He is on the keeping a food journal and adhering to recommended goals of 1650-1800 calories and 120-150 protein and states he is following his eating plan approximately UNSURE % of the time. 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   Interim History:  Starting Bioimpedance results on 01/06/2023: Weight 281 lbs with corresponding BMI 34. Waist Measurement 49" RMR 2074 Visceral Adipose Rating 18  Bioimpedance results 08/25/2022 Weight 233 lbs with corresponding BMI 28 Visceral Adipose Rating 13  He would like to loss another 10 lbs, then enter into Mx phase. At next OV, recommend completing IC to obtain RMR.  Subjective:   1. Essential hypertension Mr. Tony [name]  has been recoding blood pressure at home and these are readings: 124/78, 115/78, 136/83,124/78, 119/81, 139/87, 140/81, 122/81, 149/76, 128/75, 141/74, 121/78, 126/74, 103/74, 112/74 He is currently on Losartan/HCTS 50/12.5mg  QD- this has been slowly weaned down since Fall 2023 during his weight loss journey.  He endorses dizziness. He denies CP with exertion. He denies tobacco/vape use. Pertinent Family Hx: He reports that his father had HTN and  suffered CVA. He reports that his mother suffered and survived MI.  2. Vitamin D deficiency  Latest Reference Range & Units 01/05/22 09:41  Vitamin D, 25-Hydroxy 30.0 - 100.0 ng/mL 39.2  He is currently on OTV Vit D3 5,000 IU QD  3. Prediabetes  Latest Reference Range & Units 01/05/22 09:41  Glucose 70 - 99 mg/dL 94  Hemoglobin V8P 4.8 - 5.6 % 5.7 (H)  Est. average glucose Bld gHb Est-mCnc mg/dL 929  INSULIN 2.6 - 24.4 uIU/mL 14.9  (H): Data is abnormally high He denies first degree family T2D.  4. Vitamin B12 deficiency  Latest Reference Range & Units 01/05/22 09:41  Vitamin B12 232 - 1,245 pg/mL 214 (L)  (L): Data is abnormally low He denies any B12 supplementation.  Assessment/Plan:   1. Essential hypertension Check Labs - Comprehensive metabolic panel Refill and REDUCE (remove diuretic component) Losartan 50mg  QD Disp 90 Rf 0  2. Vitamin D deficiency Check  Labs - VITAMIN D 25 Hydroxy (Vit-D Deficiency, Fractures)  3. Prediabetes Check Labs - Hemoglobin A1c - Insulin, random  4. Vitamin B12 deficiency Check Labs - Vitamin B12  5. Obesity with current BMI of 29.47  Tony [name] is currently in the action stage of change. As such, his goal is to continue with weight loss efforts. He has agreed to keeping a food journal and adhering to recommended goals of 1650-1800 calories and 120-150 protein.   CONSIDER STARTING MX PHASE AT NEXT OV.  Exercise goals: For substantial health benefits, adults should do at least 150 minutes (2 hours and 30 minutes) a  week of moderate-intensity, or 75 minutes (1 hour and 15 minutes) a week of vigorous-intensity aerobic physical activity, or an equivalent combination of moderate- and vigorous-intensity aerobic activity. Aerobic activity should be performed in episodes of at least 10 minutes, and preferably, it should be spread throughout the week.  Behavioral modification strategies: increasing lean protein intake, decreasing simple  carbohydrates, increasing vegetables, increasing water intake, no skipping meals, meal planning and cooking strategies, planning for success, and keeping a strict food journal.  Tony [name] has agreed to follow-up with our clinic in 6 weeks. He was informed of the importance of frequent follow-up visits to maximize his success with intensive lifestyle modifications for his multiple health conditions.   Check IC at next OV- pt aware to arrive fasting and to arrive 30 mins prior to scheduled OV time.  Objective:   Blood pressure 112/74, pulse 77, temperature 98.1 F (36.7 C), height 6\' 4"  (1.93 m), weight 233 lb (105.7 kg), SpO2 98 %. Body mass index is 28.36 kg/m.  General: Cooperative, alert, well developed, in no acute distress. HEENT: Conjunctivae and lids unremarkable. Cardiovascular: Regular rhythm.  Lungs: Normal work of breathing. Neurologic: No focal deficits.   Lab Results  Component Value Date   CREATININE 0.97 01/05/2022   BUN 15 01/05/2022   NA 143 01/05/2022   K 3.8 01/05/2022   CL 104 01/05/2022   CO2 24 01/05/2022   Lab Results  Component Value Date   ALT 41 01/05/2022   AST 34 01/05/2022   ALKPHOS 73 01/05/2022   BILITOT 0.8 01/05/2022   Lab Results  Component Value Date   HGBA1C 5.7 (H) 01/05/2022   Lab Results  Component Value Date   INSULIN 14.9 01/05/2022   Lab Results  Component Value Date   TSH 1.450 01/05/2022   Lab Results  Component Value Date   CHOL 181 01/05/2022   HDL 45 01/05/2022   LDLCALC 116 (H) 01/05/2022   TRIG 110 01/05/2022   Lab Results  Component Value Date   VD25OH 39.2 01/05/2022   Lab Results  Component Value Date   WBC 3.7 01/05/2022   HGB 14.6 01/05/2022   HCT 41.9 01/05/2022   MCV 93 01/05/2022   PLT 142 (L) 01/05/2022   No results found for: "IRON", "TIBC", "FERRITIN"  Attestation Statements:   Reviewed by clinician on day of visit: allergies, medications, problem list, medical history, surgical history,  family history, social history, and previous encounter notes.  I have reviewed the above documentation for accuracy and completeness, and I agree with the above. Orpha Bur-  Derreck Wiltsey

## 2022-08-26 LAB — COMPREHENSIVE METABOLIC PANEL
ALT: 17 IU/L (ref 0–44)
AST: 26 IU/L (ref 0–40)
Albumin/Globulin Ratio: 2 (ref 1.2–2.2)
Albumin: 4.3 g/dL (ref 3.9–4.9)
Alkaline Phosphatase: 72 IU/L (ref 44–121)
BUN/Creatinine Ratio: 16 (ref 10–24)
BUN: 17 mg/dL (ref 8–27)
Bilirubin Total: 0.8 mg/dL (ref 0.0–1.2)
CO2: 25 mmol/L (ref 20–29)
Calcium: 9.9 mg/dL (ref 8.6–10.2)
Chloride: 104 mmol/L (ref 96–106)
Creatinine, Ser: 1.08 mg/dL (ref 0.76–1.27)
Globulin, Total: 2.1 g/dL (ref 1.5–4.5)
Glucose: 93 mg/dL (ref 70–99)
Potassium: 4.3 mmol/L (ref 3.5–5.2)
Sodium: 143 mmol/L (ref 134–144)
Total Protein: 6.4 g/dL (ref 6.0–8.5)
eGFR: 77 mL/min/{1.73_m2} (ref >59)

## 2022-08-26 LAB — HEMOGLOBIN A1C
Est. average glucose Bld gHb Est-mCnc: 111 mg/dL
Hgb A1c MFr Bld: 5.5 % (ref 4.8–5.6)

## 2022-08-26 LAB — VITAMIN D 25 HYDROXY (VIT D DEFICIENCY, FRACTURES): Vit D, 25-Hydroxy: 62.1 ng/mL (ref 30.0–100.0)

## 2022-08-26 LAB — INSULIN, RANDOM: INSULIN: 5.1 u[IU]/mL (ref 2.6–24.9)

## 2022-08-26 LAB — VITAMIN B12: Vitamin B-12: 257 pg/mL (ref 232–1245)

## 2022-09-14 DIAGNOSIS — G4733 Obstructive sleep apnea (adult) (pediatric): Secondary | ICD-10-CM | POA: Diagnosis not present

## 2022-10-12 ENCOUNTER — Ambulatory Visit (INDEPENDENT_AMBULATORY_CARE_PROVIDER_SITE_OTHER): Payer: BC Managed Care – PPO | Admitting: Adult Health

## 2022-10-14 ENCOUNTER — Encounter (INDEPENDENT_AMBULATORY_CARE_PROVIDER_SITE_OTHER): Payer: Self-pay | Admitting: Family Medicine

## 2022-10-14 ENCOUNTER — Ambulatory Visit (INDEPENDENT_AMBULATORY_CARE_PROVIDER_SITE_OTHER): Payer: BC Managed Care – PPO | Admitting: Family Medicine

## 2022-10-14 VITALS — BP 116/70 | HR 64 | Temp 97.8°F | Ht 76.0 in | Wt 231.0 lb

## 2022-10-14 DIAGNOSIS — Z6828 Body mass index (BMI) 28.0-28.9, adult: Secondary | ICD-10-CM | POA: Diagnosis not present

## 2022-10-14 DIAGNOSIS — E669 Obesity, unspecified: Secondary | ICD-10-CM

## 2022-10-14 DIAGNOSIS — E66811 Obesity, class 1: Secondary | ICD-10-CM

## 2022-10-14 DIAGNOSIS — R7303 Prediabetes: Secondary | ICD-10-CM | POA: Diagnosis not present

## 2022-10-14 DIAGNOSIS — E559 Vitamin D deficiency, unspecified: Secondary | ICD-10-CM | POA: Diagnosis not present

## 2022-10-14 MED ORDER — VITAMIN D3 125 MCG (5000 UT) PO CAPS
5000.0000 [IU] | ORAL_CAPSULE | Freq: Every day | ORAL | Status: DC
Start: 2022-10-14 — End: 2023-04-21

## 2022-10-14 NOTE — Progress Notes (Signed)
Chief Complaint:   OBESITY Tony [name] is here to discuss his progress with his obesity treatment plan along with follow-up of his obesity related diagnoses. Tony [name] is on keeping a food journal and adhering to recommended goals of 1650-1800 calories and 120-150 protein and states he is following his eating plan approximately 82% of the time. Tony [name] states he is exercising 90 minutes 7 times per week.  Today's visit was #: 10 Starting weight: 281 lb Starting date: 01/05/2022 Today's weight: 231 lb Today's date: 10/14/2022 Total lbs lost to date: 50 lb Total lbs lost since last in-office visit: 2 lb  Interim History: Patient voices he lapsed in his food program.  Above average alcohol intake and not so much focus on nutrition and calories.  Was able to recognize that the indulgence time had ended when he came back home and got back to new mindful eating.  He could enjoy his 6 day lapse and realized its not longer discipline to eat on plan it is more of a preference. Over the next few weeks he will have weekend trips that include more control of food and alcohol intake. He is walking 3 days a week and then alternating with weight training.    Subjective:   1. Vitamin D deficiency Patient has been on vitamin D 50,000 IU weekly.  Patient denies nausea, vomiting, muscle weakness but is positive for fatigue.  2. Prediabetes Last A1c normal at 5.5.  Insulin normal at 5.1.  Assessment/Plan:   1. Vitamin D deficiency Start OTC- Cholecalciferol (VITAMIN D3) 125 MCG (5000 UT) CAPS; Take 1 capsule (5,000 Units total) by mouth daily.  Dispense: 30 capsule  2. Prediabetes Continue journaling and repeat labs in 4 to 6 months.  3. BMI 28.0-28.9,adult  4. Obesity with starting BMI of 34.2 Tony [name] is currently in the action stage of change. As such, his goal is to continue with weight loss efforts. He has agreed to keeping a food journal and adhering to recommended goals of 1650-1800 calories and 150+  protein daily.   Exercise goals:  As is.  Behavioral modification strategies: increasing lean protein intake, meal planning and cooking strategies, keeping healthy foods in the home, and planning for success.  Tony [name] has agreed to follow-up with our clinic in 6 weeks. He was informed of the importance of frequent follow-up visits to maximize his success with intensive lifestyle modifications for his multiple health conditions.   Objective:   Blood pressure 116/70, pulse 64, temperature 97.8 F (36.6 C), height 6\' 4"  (1.93 m), weight 231 lb (104.8 kg), SpO2 98 %. Body mass index is 28.12 kg/m.  General: Cooperative, alert, well developed, in no acute distress. HEENT: Conjunctivae and lids unremarkable. Cardiovascular: Regular rhythm.  Lungs: Normal work of breathing. Neurologic: No focal deficits.   Lab Results  Component Value Date   CREATININE 1.08 08/25/2022   BUN 17 08/25/2022   NA 143 08/25/2022   K 4.3 08/25/2022   CL 104 08/25/2022   CO2 25 08/25/2022   Lab Results  Component Value Date   ALT 17 08/25/2022   AST 26 08/25/2022   ALKPHOS 72 08/25/2022   BILITOT 0.8 08/25/2022   Lab Results  Component Value Date   HGBA1C 5.5 08/25/2022   HGBA1C 5.7 (H) 01/05/2022   Lab Results  Component Value Date   INSULIN 5.1 08/25/2022   INSULIN 14.9 01/05/2022   Lab Results  Component Value Date   TSH 1.450 01/05/2022   Lab Results  Component  Value Date   CHOL 181 01/05/2022   HDL 45 01/05/2022   LDLCALC 116 (H) 01/05/2022   TRIG 110 01/05/2022   Lab Results  Component Value Date   VD25OH 62.1 08/25/2022   VD25OH 39.2 01/05/2022   Lab Results  Component Value Date   WBC 3.7 01/05/2022   HGB 14.6 01/05/2022   HCT 41.9 01/05/2022   MCV 93 01/05/2022   PLT 142 (L) 01/05/2022   No results found for: "IRON", "TIBC", "FERRITIN"  Attestation Statements:   Reviewed by clinician on day of visit: allergies, medications, problem list, medical history, surgical  history, family history, social history, and previous encounter notes.  I, Tony [name], RMA, am acting as transcriptionist for Reuben Likes, MD.  I have reviewed the above documentation for accuracy and completeness, and I agree with the above. - Reuben Likes, MD

## 2022-11-15 ENCOUNTER — Other Ambulatory Visit (INDEPENDENT_AMBULATORY_CARE_PROVIDER_SITE_OTHER): Payer: Self-pay

## 2022-11-15 ENCOUNTER — Telehealth (INDEPENDENT_AMBULATORY_CARE_PROVIDER_SITE_OTHER): Payer: Self-pay | Admitting: Family Medicine

## 2022-11-15 ENCOUNTER — Other Ambulatory Visit (INDEPENDENT_AMBULATORY_CARE_PROVIDER_SITE_OTHER): Payer: Self-pay | Admitting: Adult Health

## 2022-11-15 ENCOUNTER — Other Ambulatory Visit (INDEPENDENT_AMBULATORY_CARE_PROVIDER_SITE_OTHER): Payer: Self-pay | Admitting: Family Medicine

## 2022-11-15 ENCOUNTER — Telehealth (INDEPENDENT_AMBULATORY_CARE_PROVIDER_SITE_OTHER): Payer: Self-pay

## 2022-11-15 DIAGNOSIS — I10 Essential (primary) hypertension: Secondary | ICD-10-CM

## 2022-11-15 MED ORDER — LOSARTAN POTASSIUM 50 MG PO TABS
50.0000 mg | ORAL_TABLET | Freq: Every day | ORAL | 0 refills | Status: DC
Start: 2022-11-15 — End: 2022-11-29

## 2022-11-15 NOTE — Telephone Encounter (Signed)
Patient calling asking for his blood pressure medication.  He did not have to follow up for 6 weeks.

## 2022-11-15 NOTE — Telephone Encounter (Signed)
Ok to refill losartin for 30 days, can get a 90 day rx at follow up visit.

## 2022-11-15 NOTE — Telephone Encounter (Signed)
LAST APPOINTMENT DATE: 10/14/2022 NEXT APPOINTMENT DATE: 11/29/2022   [id number] - [place], [place] - [address] AT [facility] OF [address] [place] [address] Phone: [phone] Fax: [phone]  [id number] [place], [place] - [address] AT [facility] OF [address] & [facility] [address] [place] [address] Phone: [phone] Fax: [phone]  Patient is requesting a refill of the following medications: Requested Prescriptions    No prescriptions requested or ordered in this encounter    Date last filled: 08/25/2022 Previously prescribed by William Hamburger  Lab Results  Component Value Date   HGBA1C 5.5 08/25/2022   HGBA1C 5.7 (H) 01/05/2022   Lab Results  Component Value Date   LDLCALC 116 (H) 01/05/2022   CREATININE 1.08 08/25/2022   Lab Results  Component Value Date   VD25OH 62.1 08/25/2022   VD25OH 39.2 01/05/2022    BP Readings from Last 3 Encounters:  10/14/22 116/70  08/25/22 112/74  07/26/22 113/74

## 2022-11-15 NOTE — Telephone Encounter (Signed)
Patient stating he needs a refill of Vitamin D. Please advise!

## 2022-11-15 NOTE — Telephone Encounter (Signed)
Opened in error

## 2022-11-15 NOTE — Addendum Note (Signed)
Addended by: Teressa Senter on: 11/15/2022 04:58 PM   Modules accepted: Orders

## 2022-11-29 ENCOUNTER — Encounter (INDEPENDENT_AMBULATORY_CARE_PROVIDER_SITE_OTHER): Payer: Self-pay | Admitting: Family Medicine

## 2022-11-29 ENCOUNTER — Ambulatory Visit (INDEPENDENT_AMBULATORY_CARE_PROVIDER_SITE_OTHER): Payer: BC Managed Care – PPO | Admitting: Family Medicine

## 2022-11-29 VITALS — BP 121/76 | HR 64 | Temp 98.0°F | Ht 76.0 in | Wt 220.0 lb

## 2022-11-29 DIAGNOSIS — I10 Essential (primary) hypertension: Secondary | ICD-10-CM

## 2022-11-29 DIAGNOSIS — R7303 Prediabetes: Secondary | ICD-10-CM | POA: Diagnosis not present

## 2022-11-29 DIAGNOSIS — E669 Obesity, unspecified: Secondary | ICD-10-CM

## 2022-11-29 DIAGNOSIS — Z6826 Body mass index (BMI) 26.0-26.9, adult: Secondary | ICD-10-CM

## 2022-11-29 MED ORDER — LOSARTAN POTASSIUM 50 MG PO TABS
25.0000 mg | ORAL_TABLET | Freq: Every day | ORAL | 0 refills | Status: DC
Start: 2022-11-29 — End: 2023-04-21

## 2022-11-29 NOTE — Progress Notes (Signed)
Chief Complaint:   OBESITY Tony [name] is here to discuss his progress with his obesity treatment plan along with follow-up of his obesity related diagnoses. Tony [name] is on keeping a food journal and adhering to recommended goals of 1650-1800 calories and 150+ grams of protein and states he is following his eating plan approximately 92% of the time. Tony [name] states he is exercising, swimming, and playing Tennis for 90 minutes 7 times per week.  Today's visit was #: 11 Starting weight: 281 lbs Starting date: 01/05/2022 Today's weight: 220 lbs Today's date: 11/29/2022 Total lbs lost to date: 61 Total lbs lost since last in-office visit: 11  Interim History: Patient has really committed to the food and exercise regiment that he has created for himself.  He is logging but doesn't feel he really needs to know but he feels he knows the nutritional validity and values of the food he is taking in.  He is doing various forms of exercise- swimming, spinning, golf, jump rope, strength, tennis and hiking.  Next 6-8 weeks he has about the same in terms of activities and events.  He feels confident he understands how the food choices he makes will impact him long term.   Subjective:   1. Essential hypertension Patient is on losartan 50 mg once daily.  He denies chest pain, chest pressure, headache, or dizziness.  2. Prediabetes Patient's recent A1c was 5.5 (prior 5.7).  He is not on medications.  Assessment/Plan:   1. Essential hypertension Patient agreed to decrease losartan to 25 mg once daily, and we will refill for 1 month.  We will follow-up on his blood pressure at his next appointment in office.  - losartan (COZAAR) 50 MG tablet; Take 0.5 tablets (25 mg total) by mouth daily.  Dispense: 45 tablet; Refill: 0  2. Prediabetes Patient will have repeat labs with his PCP, and he will continue his current plan.  3. BMI 26.0-26.9,adult  4. Obesity with starting BMI of 34.2 Tony [name] is currently in the  action stage of change. As such, his goal is to continue with weight loss efforts. He has agreed to keeping a food journal and adhering to recommended goals of 1650-1800 calories and 150+ grams of protein daily.   Exercise goals: As is.   Behavioral modification strategies: increasing lean protein intake, meal planning and cooking strategies, keeping healthy foods in the home, and planning for success.  Tony [name] has agreed to follow-up with our clinic in 8 weeks. He was informed of the importance of frequent follow-up visits to maximize his success with intensive lifestyle modifications for his multiple health conditions.   Objective:   Blood pressure 121/76, pulse 64, temperature 98 F (36.7 C), height 6\' 4"  (1.93 m), weight 220 lb (99.8 kg), SpO2 91%. Body mass index is 26.78 kg/m.  General: Cooperative, alert, well developed, in no acute distress. HEENT: Conjunctivae and lids unremarkable. Cardiovascular: Regular rhythm.  Lungs: Normal work of breathing. Neurologic: No focal deficits.   Lab Results  Component Value Date   CREATININE 1.08 08/25/2022   BUN 17 08/25/2022   NA 143 08/25/2022   K 4.3 08/25/2022   CL 104 08/25/2022   CO2 25 08/25/2022   Lab Results  Component Value Date   ALT 17 08/25/2022   AST 26 08/25/2022   ALKPHOS 72 08/25/2022   BILITOT 0.8 08/25/2022   Lab Results  Component Value Date   HGBA1C 5.5 08/25/2022   HGBA1C 5.7 (H) 01/05/2022   Lab Results  Component  Value Date   INSULIN 5.1 08/25/2022   INSULIN 14.9 01/05/2022   Lab Results  Component Value Date   TSH 1.450 01/05/2022   Lab Results  Component Value Date   CHOL 181 01/05/2022   HDL 45 01/05/2022   LDLCALC 116 (H) 01/05/2022   TRIG 110 01/05/2022   Lab Results  Component Value Date   VD25OH 62.1 08/25/2022   VD25OH 39.2 01/05/2022   Lab Results  Component Value Date   WBC 3.7 01/05/2022   HGB 14.6 01/05/2022   HCT 41.9 01/05/2022   MCV 93 01/05/2022   PLT 142 (L)  01/05/2022   No results found for: "IRON", "TIBC", "FERRITIN"  Attestation Statements:   Reviewed by clinician on day of visit: allergies, medications, problem list, medical history, surgical history, family history, social history, and previous encounter notes.   I, Burt Knack, am acting as transcriptionist for Reuben Likes, MD.  I have reviewed the above documentation for accuracy and completeness, and I agree with the above. - Reuben Likes, MD

## 2022-12-03 DIAGNOSIS — R300 Dysuria: Secondary | ICD-10-CM | POA: Diagnosis not present

## 2023-02-03 DIAGNOSIS — G4733 Obstructive sleep apnea (adult) (pediatric): Secondary | ICD-10-CM | POA: Diagnosis not present

## 2023-02-07 ENCOUNTER — Encounter (INDEPENDENT_AMBULATORY_CARE_PROVIDER_SITE_OTHER): Payer: Self-pay | Admitting: Family Medicine

## 2023-02-07 ENCOUNTER — Ambulatory Visit (INDEPENDENT_AMBULATORY_CARE_PROVIDER_SITE_OTHER): Payer: BC Managed Care – PPO | Admitting: Family Medicine

## 2023-02-07 VITALS — BP 120/63 | HR 78 | Temp 98.3°F | Ht 76.0 in | Wt 224.0 lb

## 2023-02-07 DIAGNOSIS — Z6827 Body mass index (BMI) 27.0-27.9, adult: Secondary | ICD-10-CM

## 2023-02-07 DIAGNOSIS — E559 Vitamin D deficiency, unspecified: Secondary | ICD-10-CM

## 2023-02-07 DIAGNOSIS — E669 Obesity, unspecified: Secondary | ICD-10-CM | POA: Diagnosis not present

## 2023-02-07 DIAGNOSIS — I10 Essential (primary) hypertension: Secondary | ICD-10-CM | POA: Diagnosis not present

## 2023-02-07 NOTE — Progress Notes (Unsigned)
Chief Complaint:   OBESITY Tony [name] is here to discuss his progress with his obesity treatment plan along with follow-up of his obesity related diagnoses. Tony [name] is on keeping a food journal and adhering to recommended goals of 1650-1800 calories and 150+ grams of protein and states he is following his eating plan approximately 82.7% of the time. Tony [name] states he is swimming, spin, functional weights, walking, and hiking for 90 minutes 7 times per week.  Today's visit was #: 12 Starting weight: 281 lbs Starting date: 01/05/2022 Today's weight: 224 lbs Today's date: 02/07/2023 Total lbs lost to date: 57 Total lbs lost since last in-office visit: 0  Interim History: Since last appointment patient has had a medical emergency in his family and now his daughter is living at home with him and his wife.  He relaxed his focus and did some impulse and comfort eating.  He mentions he didn't commit the time to eat as well and on plan as he did previously.  He has recovered in  his focus to the plan.  Contributing factors he can realize were temporary.  Subjective:   1. Essential hypertension Patient's blood pressure is controlled today.  He denies chest pain, chest pressure, or headache.  He is on Cozaar 25 mg daily.  2. Vitamin D deficiency Patient is on OTC vitamin D, and he notes fatigue.  Assessment/Plan:   1. Essential hypertension Patient will continue Cozaar with no change in dose.  If his blood pressure decreases to systolic below 110, may want to consider further medication decrease.  2. Vitamin D deficiency Patient will continue OTC with no change in dose.  He will have labs repeated with his PCP.  3. BMI 27.0-27.9,adult  4. Obesity with starting BMI of 34.2 Tony [name] is currently in the action stage of change. As such, his goal is to continue with weight loss efforts. He has agreed to keeping a food journal and adhering to recommended goals of 1650-1850 calories and 150+ grams of  protein daily.   Patient is to plan to find 9 food options for comfort and time out.  Exercise goals: As is.   Behavioral modification strategies: increasing lean protein intake, meal planning and cooking strategies, keeping healthy foods in the home, and planning for success.  Tony [name] has agreed to follow-up with our clinic in 8 to 10 weeks. He was informed of the importance of frequent follow-up visits to maximize his success with intensive lifestyle modifications for his multiple health conditions.   Objective:   Blood pressure 120/63, pulse 78, temperature 98.3 F (36.8 C), height 6\' 4"  (1.93 m), weight 224 lb (101.6 kg), SpO2 98%. Body mass index is 27.27 kg/m.  General: Cooperative, alert, well developed, in no acute distress. HEENT: Conjunctivae and lids unremarkable. Cardiovascular: Regular rhythm.  Lungs: Normal work of breathing. Neurologic: No focal deficits.   Lab Results  Component Value Date   CREATININE 1.08 08/25/2022   BUN 17 08/25/2022   NA 143 08/25/2022   K 4.3 08/25/2022   CL 104 08/25/2022   CO2 25 08/25/2022   Lab Results  Component Value Date   ALT 17 08/25/2022   AST 26 08/25/2022   ALKPHOS 72 08/25/2022   BILITOT 0.8 08/25/2022   Lab Results  Component Value Date   HGBA1C 5.5 08/25/2022   HGBA1C 5.7 (H) 01/05/2022   Lab Results  Component Value Date   INSULIN 5.1 08/25/2022   INSULIN 14.9 01/05/2022   Lab Results  Component Value  Date   TSH 1.450 01/05/2022   Lab Results  Component Value Date   CHOL 181 01/05/2022   HDL 45 01/05/2022   LDLCALC 116 (H) 01/05/2022   TRIG 110 01/05/2022   Lab Results  Component Value Date   VD25OH 62.1 08/25/2022   VD25OH 39.2 01/05/2022   Lab Results  Component Value Date   WBC 3.7 01/05/2022   HGB 14.6 01/05/2022   HCT 41.9 01/05/2022   MCV 93 01/05/2022   PLT 142 (L) 01/05/2022   No results found for: "IRON", "TIBC", "FERRITIN"  Attestation Statements:   Reviewed by clinician on day  of visit: allergies, medications, problem list, medical history, surgical history, family history, social history, and previous encounter notes.   I, Burt Knack, am acting as transcriptionist for Reuben Likes, MD.  I have reviewed the above documentation for accuracy and completeness, and I agree with the above. - Reuben Likes, MD

## 2023-02-17 DIAGNOSIS — H60331 Swimmer's ear, right ear: Secondary | ICD-10-CM | POA: Diagnosis not present

## 2023-03-20 ENCOUNTER — Other Ambulatory Visit (INDEPENDENT_AMBULATORY_CARE_PROVIDER_SITE_OTHER): Payer: Self-pay | Admitting: Family Medicine

## 2023-03-20 DIAGNOSIS — I10 Essential (primary) hypertension: Secondary | ICD-10-CM

## 2023-04-21 ENCOUNTER — Ambulatory Visit (INDEPENDENT_AMBULATORY_CARE_PROVIDER_SITE_OTHER): Payer: Self-pay | Admitting: Family Medicine

## 2023-04-21 ENCOUNTER — Encounter (INDEPENDENT_AMBULATORY_CARE_PROVIDER_SITE_OTHER): Payer: Self-pay | Admitting: Family Medicine

## 2023-04-21 VITALS — BP 138/88 | HR 63 | Temp 97.7°F | Ht 76.0 in | Wt 229.0 lb

## 2023-04-21 DIAGNOSIS — I10 Essential (primary) hypertension: Secondary | ICD-10-CM

## 2023-04-21 DIAGNOSIS — E66811 Body mass index (BMI) 34.0-34.9, adult: Secondary | ICD-10-CM

## 2023-04-21 DIAGNOSIS — R7303 Prediabetes: Secondary | ICD-10-CM

## 2023-04-21 DIAGNOSIS — E669 Obesity, unspecified: Secondary | ICD-10-CM | POA: Diagnosis not present

## 2023-04-21 DIAGNOSIS — Z6827 Body mass index (BMI) 27.0-27.9, adult: Secondary | ICD-10-CM

## 2023-04-21 MED ORDER — LOSARTAN POTASSIUM 50 MG PO TABS: 25.0000 mg | ORAL_TABLET | Freq: Every day | ORAL | 0 refills | Status: DC

## 2023-04-21 NOTE — Assessment & Plan Note (Signed)
Blood pressure controlled but higher end in normal.  No chest pain, chest pressure, headache.  On half a losartan tablet daily and needs refill.  Refill of losartan sent in.

## 2023-04-21 NOTE — Assessment & Plan Note (Signed)
Last A1c in April in normal range at 5.5.  Patient has been fastidious in his lifestyle changes including protein focus, calorie control and physical activity implementation.  He recognizes he was able to fall into old habits of food choices and behavioral patterns over the last 6 weeks.  He is planning to refocus on protein to decrease total carb intake.  Will repeat labs with pcp at next appointment.

## 2023-04-21 NOTE — Progress Notes (Signed)
SUBJECTIVE:  Chief Complaint: Obesity  Interim History: Patient is feeling surprised about the unconscious override and the engrained habits.  He is amazed at how easily he can fall into previous habits.  He found some activitiy to help with his mindless eating.  Continues to be a victim of visual response.  He did have some negative self talk and has incorporated some meditation to help with self awareness. His physical activity has decreased due to pool closing.  Patient declined his Christmas party in the next week.  Over the next 6 weeks he has intentional relationship investment in the people that he cares about.   Tony [name] is here to discuss his progress with his obesity treatment plan. He is on the keeping a food journal and adhering to recommended goals of 1650-1850 calories and 150 grams of protein and states he is following his eating plan approximately 50 % of the time. He states he is exercising 75 minutes 5 times per week.   OBJECTIVE: Visit Diagnoses: Problem List Items Addressed This Visit       Cardiovascular and Mediastinum   Essential hypertension    Blood pressure controlled but higher end in normal.  No chest pain, chest pressure, headache.  On half a losartan tablet daily and needs refill.  Refill of losartan sent in.      Relevant Medications   losartan (COZAAR) 50 MG tablet     Other   Prediabetes - Primary    Last A1c in April in normal range at 5.5.  Patient has been fastidious in his lifestyle changes including protein focus, calorie control and physical activity implementation.  He recognizes he was able to fall into old habits of food choices and behavioral patterns over the last 6 weeks.  He is planning to refocus on protein to decrease total carb intake.  Will repeat labs with pcp at next appointment.      Other Visit Diagnoses     BMI 27.0-27.9,adult       Obesity with starting BMI of 34.2           Vitals Temp: 97.7 F (36.5 C) BP: 138/88 Pulse  Rate: 63 SpO2: 97 %   Anthropometric Measurements Height: 6\' 4"  (1.93 m) Weight: 229 lb (103.9 kg) BMI (Calculated): 27.89 Weight at Last Visit: 224 lb Weight Lost Since Last Visit: 0 Weight Gained Since Last Visit: 5 Starting Weight: 281 lb   Body Composition  Body Fat %: 22.5 % Fat Mass (lbs): 51.6 lbs Muscle Mass (lbs): 169 lbs Total Body Water (lbs): 120.2 lbs Visceral Fat Rating : 13   Other Clinical Data Fasting: yes Labs: ? Today's Visit #: 13 Starting Date: 01/05/22     ASSESSMENT AND PLAN:  Diet: Tony [name] is currently in the action stage of change. As such, his goal is to continue with weight loss efforts. He has agreed to keeping a food journal and adhering to recommended goals of 1650-1800 calories and 150 or more grams of protein daily.  Exercise: Tony [name] has been instructed that some exercise is better than none for weight loss and overall health benefits.   Behavior Modification:  We discussed the following Behavioral Modification Strategies today: increasing lean protein intake, decreasing eating out, meal planning and cooking strategies, planning for success, and keep a strict food journal.   No follow-ups on file.Marland Kitchen He was informed of the importance of frequent follow up visits to maximize his success with intensive lifestyle modifications for his multiple health conditions.  Attestation Statements:   Reviewed by clinician on day of visit: allergies, medications, problem list, medical history, surgical history, family history, social history, and previous encounter notes.      Reuben Likes, MD

## 2023-05-24 DIAGNOSIS — R7301 Impaired fasting glucose: Secondary | ICD-10-CM | POA: Diagnosis not present

## 2023-05-24 DIAGNOSIS — C61 Malignant neoplasm of prostate: Secondary | ICD-10-CM | POA: Diagnosis not present

## 2023-05-24 DIAGNOSIS — E559 Vitamin D deficiency, unspecified: Secondary | ICD-10-CM | POA: Diagnosis not present

## 2023-05-24 DIAGNOSIS — I10 Essential (primary) hypertension: Secondary | ICD-10-CM | POA: Diagnosis not present

## 2023-05-24 LAB — COMPREHENSIVE METABOLIC PANEL
Albumin: 4.2 (ref 3.5–5.0)
Calcium: 8.9 (ref 8.7–10.7)
EGFR: 85

## 2023-05-24 LAB — BASIC METABOLIC PANEL
BUN: 19 (ref 4–21)
CO2: 31 — AB (ref 13–22)
Chloride: 106 (ref 99–108)
Creatinine: 0.9 (ref 0.6–1.3)
Glucose: 89
Potassium: 4.2 meq/L (ref 3.5–5.1)
Sodium: 142 (ref 137–147)

## 2023-05-24 LAB — HEPATIC FUNCTION PANEL
ALT: 18 U/L (ref 10–40)
AST: 23 (ref 14–40)
Alkaline Phosphatase: 68 (ref 25–125)
Bilirubin, Total: 0.9

## 2023-05-24 LAB — LIPID PANEL
Cholesterol: 187 (ref 0–200)
HDL: 50 (ref 35–70)
LDL Cholesterol: 127
Triglycerides: 50 (ref 40–160)

## 2023-05-24 LAB — HEMOGLOBIN A1C: Hemoglobin A1C: 5.1

## 2023-05-24 LAB — VITAMIN D 25 HYDROXY (VIT D DEFICIENCY, FRACTURES): Vit D, 25-Hydroxy: 34.4

## 2023-05-31 DIAGNOSIS — Z1339 Encounter for screening examination for other mental health and behavioral disorders: Secondary | ICD-10-CM | POA: Diagnosis not present

## 2023-05-31 DIAGNOSIS — Z23 Encounter for immunization: Secondary | ICD-10-CM | POA: Diagnosis not present

## 2023-05-31 DIAGNOSIS — Z1331 Encounter for screening for depression: Secondary | ICD-10-CM | POA: Diagnosis not present

## 2023-05-31 DIAGNOSIS — I10 Essential (primary) hypertension: Secondary | ICD-10-CM | POA: Diagnosis not present

## 2023-05-31 DIAGNOSIS — Z0000 Encounter for general adult medical examination without abnormal findings: Secondary | ICD-10-CM | POA: Diagnosis not present

## 2023-06-02 ENCOUNTER — Ambulatory Visit (INDEPENDENT_AMBULATORY_CARE_PROVIDER_SITE_OTHER): Payer: Self-pay | Admitting: Family Medicine

## 2023-06-02 ENCOUNTER — Ambulatory Visit (INDEPENDENT_AMBULATORY_CARE_PROVIDER_SITE_OTHER): Payer: BC Managed Care – PPO | Admitting: Family Medicine

## 2023-06-06 ENCOUNTER — Ambulatory Visit (INDEPENDENT_AMBULATORY_CARE_PROVIDER_SITE_OTHER): Payer: BC Managed Care – PPO | Admitting: Family Medicine

## 2023-06-06 ENCOUNTER — Encounter (INDEPENDENT_AMBULATORY_CARE_PROVIDER_SITE_OTHER): Payer: Self-pay | Admitting: Family Medicine

## 2023-06-06 VITALS — BP 137/88 | HR 60 | Temp 98.0°F | Ht 76.0 in | Wt 229.0 lb

## 2023-06-06 DIAGNOSIS — E669 Obesity, unspecified: Secondary | ICD-10-CM

## 2023-06-06 DIAGNOSIS — E785 Hyperlipidemia, unspecified: Secondary | ICD-10-CM | POA: Diagnosis not present

## 2023-06-06 DIAGNOSIS — Z6827 Body mass index (BMI) 27.0-27.9, adult: Secondary | ICD-10-CM

## 2023-06-06 DIAGNOSIS — Z6834 Body mass index (BMI) 34.0-34.9, adult: Secondary | ICD-10-CM

## 2023-06-06 DIAGNOSIS — E7800 Pure hypercholesterolemia, unspecified: Secondary | ICD-10-CM

## 2023-06-06 DIAGNOSIS — E559 Vitamin D deficiency, unspecified: Secondary | ICD-10-CM | POA: Diagnosis not present

## 2023-06-06 DIAGNOSIS — R7303 Prediabetes: Secondary | ICD-10-CM

## 2023-06-06 NOTE — Assessment & Plan Note (Addendum)
Recent A1c of 5.1 on May 24, 2023 with PCP.  Reviewed labs and food log today.  Patient did have a few days of excessive calorie intake over the last few weeks and some days in which she did he did not finish logging due to being out hunting.  Most days though were within his calorie goal and had achieved his protein intake goal.

## 2023-06-06 NOTE — Assessment & Plan Note (Signed)
Patient no longer on Vitamin D supplementation.  His vitamin d level at PCP visit was 34.4.  He was encouraged to start OTC Vitamin D at [facility] international units daily.

## 2023-06-06 NOTE — Assessment & Plan Note (Signed)
Patient's last FLP HDL 50, Triglycerides 50 and LDL 127.

## 2023-06-06 NOTE — Progress Notes (Unsigned)
SUBJECTIVE:  Chief Complaint: Obesity  Interim History: Patient had a great holiday season.   Got labs done with PCP and brought results today.  Logged really well consistently over the last 5-6 weeks with exception for when he was duck/ quail hunting.  Tony [name] is here to discuss his progress with his obesity treatment plan. He is on the keeping a food journal and adhering to recommended goals of 1650-1800 calories and 150 grams of protein and states he is following his eating plan approximately 92 % of the time. He states he is exercising 90 minutes 7 times per week.   OBJECTIVE: Visit Diagnoses: Problem List Items Addressed This Visit       Other   Prediabetes - Primary   Recent A1c of 5.1 on May 24, 2023 with PCP.  Reviewed labs and food log today.  Patient did have a few days of excessive calorie intake over the last few weeks and some days in which she did he did not finish logging due to being out hunting.  Most days though were within his calorie goal and had achieved his protein intake goal.      Vitamin D deficiency   Patient no longer on Vitamin D supplementation.  His vitamin d level at PCP visit was 34.4.  He was encouraged to start OTC Vitamin D at [facility] international units daily.      Relevant Medications   Cholecalciferol (VITAMIN D3) 125 MCG (5000 UT) CAPS   Hyperlipidemia   Patient's last FLP HDL 50, Triglycerides 50 and LDL 127.  This lab was done in early January 2025.  Patient had been with his few days of hunting and was available and not logging.  Anticipate LDL better controlled at next lab draw in 3 to 4 months.  May need to discuss use of cholesterol altering medication such as statins with patient  The 10-year ASCVD risk score (Arnett DK, et al., 2019) is: 14.6%*   Values used to calculate the score:     Age: 63 years     Sex: Male     Is Non-Hispanic African American: No     Diabetic: No     Tobacco smoker: No     Systolic Blood Pressure: 137 mmHg      Is BP treated: Yes     HDL Cholesterol: 50 mg/dL*     Total Cholesterol: 187 mg/dL*     * - Cholesterol units were assumed for this score calculation        No data recorded  No data recorded  No data recorded  No data recorded    ASSESSMENT AND PLAN:  Diet: Tony [name] is currently in the action stage of change. As such, his goal is to continue with weight loss efforts. He has agreed to keeping a food journal and adhering to recommended goals of 1650-1850 calories and 150 or more grams of protein daily.  Exercise: Tony [name] has been instructed to work up to a goal of 150 minutes of combined cardio and strengthening exercise per week for weight loss and overall health benefits.   Behavior Modification:  We discussed the following Behavioral Modification Strategies today: increasing lean protein intake, increasing vegetables, no skipping meals, meal planning and cooking strategies, better snacking choices, travel eating strategies, avoiding temptations, and planning for success.   No follow-ups on file.Marland Kitchen He was informed of the importance of frequent follow up visits to maximize his success with intensive lifestyle modifications for his multiple health conditions.  Attestation Statements:   Reviewed by clinician on day of visit: allergies, medications, problem list, medical history, surgical history, family history, social history, and previous encounter notes.     Reuben Likes, MD

## 2023-06-08 MED ORDER — VITAMIN D3 125 MCG (5000 UT) PO CAPS
5000.0000 [IU] | ORAL_CAPSULE | Freq: Every day | ORAL | Status: AC
Start: 2023-06-08 — End: ?

## 2023-06-27 DIAGNOSIS — L720 Epidermal cyst: Secondary | ICD-10-CM | POA: Diagnosis not present

## 2023-06-27 DIAGNOSIS — L570 Actinic keratosis: Secondary | ICD-10-CM | POA: Diagnosis not present

## 2023-07-16 ENCOUNTER — Other Ambulatory Visit (INDEPENDENT_AMBULATORY_CARE_PROVIDER_SITE_OTHER): Payer: Self-pay | Admitting: Family Medicine

## 2023-07-16 DIAGNOSIS — I10 Essential (primary) hypertension: Secondary | ICD-10-CM

## 2023-07-19 ENCOUNTER — Encounter (INDEPENDENT_AMBULATORY_CARE_PROVIDER_SITE_OTHER): Payer: Self-pay

## 2023-07-19 ENCOUNTER — Telehealth (INDEPENDENT_AMBULATORY_CARE_PROVIDER_SITE_OTHER): Payer: Self-pay | Admitting: Family Medicine

## 2023-07-19 ENCOUNTER — Other Ambulatory Visit (INDEPENDENT_AMBULATORY_CARE_PROVIDER_SITE_OTHER): Payer: Self-pay | Admitting: Family Medicine

## 2023-07-19 ENCOUNTER — Other Ambulatory Visit (INDEPENDENT_AMBULATORY_CARE_PROVIDER_SITE_OTHER): Payer: Self-pay

## 2023-07-19 DIAGNOSIS — I10 Essential (primary) hypertension: Secondary | ICD-10-CM

## 2023-07-19 MED ORDER — LOSARTAN POTASSIUM 50 MG PO TABS: 25.0000 mg | ORAL_TABLET | Freq: Every day | ORAL | 0 refills | Status: DC

## 2023-07-19 NOTE — Telephone Encounter (Signed)
 Good morning,   Patient is requesting a refill he says he has 3 days left of his medication. Was not willing to take an earlier appointment and was not happy when I could not promise him that it would be filled by the end of the day.  He requested to be contacted with an update.  Thanks!

## 2023-07-19 NOTE — Telephone Encounter (Signed)
 Prescription sent to pharmacy, message sent to pt-CS

## 2023-08-08 ENCOUNTER — Ambulatory Visit (INDEPENDENT_AMBULATORY_CARE_PROVIDER_SITE_OTHER): Payer: BC Managed Care – PPO | Admitting: Family Medicine

## 2023-08-08 ENCOUNTER — Encounter (INDEPENDENT_AMBULATORY_CARE_PROVIDER_SITE_OTHER): Payer: Self-pay | Admitting: Family Medicine

## 2023-08-08 VITALS — BP 109/63 | HR 77 | Temp 97.9°F | Ht 76.0 in | Wt 233.0 lb

## 2023-08-08 DIAGNOSIS — I10 Essential (primary) hypertension: Secondary | ICD-10-CM

## 2023-08-08 DIAGNOSIS — Z6828 Body mass index (BMI) 28.0-28.9, adult: Secondary | ICD-10-CM | POA: Diagnosis not present

## 2023-08-08 DIAGNOSIS — E7800 Pure hypercholesterolemia, unspecified: Secondary | ICD-10-CM

## 2023-08-08 DIAGNOSIS — Z6834 Body mass index (BMI) 34.0-34.9, adult: Secondary | ICD-10-CM

## 2023-08-08 DIAGNOSIS — E785 Hyperlipidemia, unspecified: Secondary | ICD-10-CM | POA: Diagnosis not present

## 2023-08-08 DIAGNOSIS — E669 Obesity, unspecified: Secondary | ICD-10-CM

## 2023-08-08 MED ORDER — LOSARTAN POTASSIUM 50 MG PO TABS: 25.0000 mg | ORAL_TABLET | Freq: Every day | ORAL | 0 refills | Status: DC

## 2023-08-08 NOTE — Assessment & Plan Note (Signed)
 Blood pressure well controlled today.  On losartan 25mg  daily.  No chest pain, chest pressure or headache.  He is post workout and steam room.  Will follow up on BP at next appointment. Refill Losartan today.

## 2023-08-08 NOTE — Progress Notes (Signed)
 SUBJECTIVE:  Chief Complaint: Obesity  Interim History: Patient here to follow up from last appointment; since that time he has had two significant experiences that altered his trajectoryh.  He added more weight to his physical exercise and had significant side effects on his knees.  His mother also died since last appointment so all of her services and end of life care fell to him.  His awareness of food choice and quantity was not where he was anticipating it to be.  Patient is planning a trip to the [place] to see the last two games of the soccer season in the next month.  Tony [name] is here to discuss his progress with his obesity treatment plan. He is on the keeping a food journal and adhering to recommended goals of 1650-1850 calories and 150 grams of protein and states he is following his eating plan approximately 23 % of the time. He states he is exercising 30 minutes 5 times per week.   OBJECTIVE: Visit Diagnoses: Problem List Items Addressed This Visit       Cardiovascular and Mediastinum   Essential hypertension - Primary   Blood pressure well controlled today.  On losartan 25mg  daily.  No chest pain, chest pressure or headache.  He is post workout and steam room.  Will follow up on BP at next appointment. Refill Losartan today.      Relevant Medications   losartan (COZAAR) 50 MG tablet     Other   Hyperlipidemia   Last LDL elevated but HDL at goal.  Triglycerides WNL.  Can't risk stratify from prior labs as values placed from PCP appt.   Will repeat labs in June 2025.         Relevant Medications   losartan (COZAAR) 50 MG tablet   Other Visit Diagnoses       Obesity with starting BMI of 34.2         BMI 28.0-28.9,adult           Vitals Temp: 97.9 F (36.6 C) BP: 109/63 Pulse Rate: 77 SpO2: 97 %   Anthropometric Measurements Height: 6\' 4"  (1.93 m) Weight: 233 lb (105.7 kg) BMI (Calculated): 28.37 Weight at Last Visit: 229 lb Weight Lost Since Last Visit:  0 Weight Gained Since Last Visit: 4 Starting Weight: 281 lb Total Weight Loss (lbs): 48 lb (21.8 kg)   Body Composition  Body Fat %: 23.7 % Fat Mass (lbs): 55.2 lbs Muscle Mass (lbs): 169 lbs Total Body Water (lbs): 118.4 lbs Visceral Fat Rating : 13   Other Clinical Data Fasting: yes Labs: no Today's Visit #: 15 Starting Date: 01/05/22 Comments: 1650-1850/150     ASSESSMENT AND PLAN:  Diet: Tony [name] is currently in the action stage of change. As such, his goal is to continue with weight loss efforts and has agreed to keeping a food journal and adhering to recommended goals of 1650-1850 calories and 150 or more grams protein daily.   Exercise:  Adults should also include muscle-strengthening activities that involve all major muscle groups on 2 or more days a week.  Behavior Modification:  We discussed the following Behavioral Modification Strategies today: increasing lean protein intake, increasing vegetables, meal planning and cooking strategies, planning for success, and keep a strict food journal.   Return in about 3 months (around 11/08/2023).Marland Kitchen He was informed of the importance of frequent follow up visits to maximize his success with intensive lifestyle modifications for his multiple health conditions.  Attestation Statements:   Reviewed by  clinician on day of visit: allergies, medications, problem list, medical history, surgical history, family history, social history, and previous encounter notes.     Tony Likes, MD

## 2023-08-08 NOTE — Assessment & Plan Note (Signed)
 Last LDL elevated but HDL at goal.  Triglycerides WNL.  Can't risk stratify from prior labs as values placed from PCP appt.   Will repeat labs in June 2025.

## 2023-08-24 DIAGNOSIS — H5213 Myopia, bilateral: Secondary | ICD-10-CM | POA: Diagnosis not present

## 2023-08-24 DIAGNOSIS — H43813 Vitreous degeneration, bilateral: Secondary | ICD-10-CM | POA: Diagnosis not present

## 2023-08-24 DIAGNOSIS — H2513 Age-related nuclear cataract, bilateral: Secondary | ICD-10-CM | POA: Diagnosis not present

## 2023-08-31 DIAGNOSIS — L570 Actinic keratosis: Secondary | ICD-10-CM | POA: Diagnosis not present

## 2023-09-14 ENCOUNTER — Other Ambulatory Visit: Payer: Self-pay | Admitting: Internal Medicine

## 2023-09-14 DIAGNOSIS — Z0000 Encounter for general adult medical examination without abnormal findings: Secondary | ICD-10-CM

## 2023-09-23 ENCOUNTER — Ambulatory Visit
Admission: RE | Admit: 2023-09-23 | Discharge: 2023-09-23 | Disposition: A | Discharge status: Home / Self Care | Payer: Self-pay | Source: Ambulatory Visit | Attending: Internal Medicine | Admitting: Internal Medicine

## 2023-09-23 DIAGNOSIS — Z0000 Encounter for general adult medical examination without abnormal findings: Secondary | ICD-10-CM

## 2023-10-18 DIAGNOSIS — I2584 Coronary atherosclerosis due to calcified coronary lesion: Secondary | ICD-10-CM | POA: Diagnosis not present

## 2023-10-18 DIAGNOSIS — E785 Hyperlipidemia, unspecified: Secondary | ICD-10-CM | POA: Diagnosis not present

## 2023-11-08 ENCOUNTER — Ambulatory Visit (INDEPENDENT_AMBULATORY_CARE_PROVIDER_SITE_OTHER): Admitting: Family Medicine

## 2023-11-15 ENCOUNTER — Other Ambulatory Visit (INDEPENDENT_AMBULATORY_CARE_PROVIDER_SITE_OTHER): Payer: Self-pay | Admitting: Family Medicine

## 2023-11-15 DIAGNOSIS — I10 Essential (primary) hypertension: Secondary | ICD-10-CM

## 2023-11-16 ENCOUNTER — Other Ambulatory Visit (INDEPENDENT_AMBULATORY_CARE_PROVIDER_SITE_OTHER): Payer: Self-pay

## 2023-11-16 ENCOUNTER — Telehealth (INDEPENDENT_AMBULATORY_CARE_PROVIDER_SITE_OTHER): Payer: Self-pay | Admitting: Family Medicine

## 2023-11-16 ENCOUNTER — Other Ambulatory Visit (INDEPENDENT_AMBULATORY_CARE_PROVIDER_SITE_OTHER): Payer: Self-pay | Admitting: Family Medicine

## 2023-11-16 ENCOUNTER — Encounter (INDEPENDENT_AMBULATORY_CARE_PROVIDER_SITE_OTHER): Payer: Self-pay

## 2023-11-16 DIAGNOSIS — I10 Essential (primary) hypertension: Secondary | ICD-10-CM

## 2023-11-16 MED ORDER — LOSARTAN POTASSIUM 50 MG PO TABS: 25.0000 mg | ORAL_TABLET | Freq: Every day | ORAL | 0 refills | Status: DC

## 2023-11-16 NOTE — Telephone Encounter (Signed)
 Rx sent x 1 month til next appointment-CS

## 2023-11-16 NOTE — Telephone Encounter (Signed)
 Pt called in stating he needs a refill for his Losartan , he cx his 11/21/23 appt stating he will be out of town and he only has a few more days lefts of the medication. Please follow up with the patient.

## 2023-11-21 ENCOUNTER — Ambulatory Visit (INDEPENDENT_AMBULATORY_CARE_PROVIDER_SITE_OTHER): Admitting: Family Medicine

## 2023-11-29 DIAGNOSIS — M1711 Unilateral primary osteoarthritis, right knee: Secondary | ICD-10-CM | POA: Diagnosis not present

## 2023-11-29 DIAGNOSIS — M1712 Unilateral primary osteoarthritis, left knee: Secondary | ICD-10-CM | POA: Diagnosis not present

## 2023-11-29 DIAGNOSIS — M170 Bilateral primary osteoarthritis of knee: Secondary | ICD-10-CM | POA: Diagnosis not present

## 2023-11-30 DIAGNOSIS — C61 Malignant neoplasm of prostate: Secondary | ICD-10-CM | POA: Diagnosis not present

## 2023-12-13 DIAGNOSIS — R351 Nocturia: Secondary | ICD-10-CM | POA: Diagnosis not present

## 2023-12-13 DIAGNOSIS — N5201 Erectile dysfunction due to arterial insufficiency: Secondary | ICD-10-CM | POA: Diagnosis not present

## 2023-12-13 DIAGNOSIS — C61 Malignant neoplasm of prostate: Secondary | ICD-10-CM | POA: Diagnosis not present

## 2023-12-19 ENCOUNTER — Other Ambulatory Visit (INDEPENDENT_AMBULATORY_CARE_PROVIDER_SITE_OTHER): Payer: Self-pay | Admitting: Family Medicine

## 2023-12-19 DIAGNOSIS — I10 Essential (primary) hypertension: Secondary | ICD-10-CM

## 2023-12-20 ENCOUNTER — Other Ambulatory Visit (INDEPENDENT_AMBULATORY_CARE_PROVIDER_SITE_OTHER): Payer: Self-pay | Admitting: Family Medicine

## 2023-12-20 ENCOUNTER — Telehealth (INDEPENDENT_AMBULATORY_CARE_PROVIDER_SITE_OTHER): Payer: Self-pay | Admitting: Family Medicine

## 2023-12-20 ENCOUNTER — Other Ambulatory Visit (INDEPENDENT_AMBULATORY_CARE_PROVIDER_SITE_OTHER): Payer: Self-pay

## 2023-12-20 ENCOUNTER — Encounter (INDEPENDENT_AMBULATORY_CARE_PROVIDER_SITE_OTHER): Payer: Self-pay

## 2023-12-20 DIAGNOSIS — I10 Essential (primary) hypertension: Secondary | ICD-10-CM

## 2023-12-20 MED ORDER — LOSARTAN POTASSIUM 50 MG PO TABS: 25.0000 mg | ORAL_TABLET | Freq: Every day | ORAL | 0 refills | Status: AC

## 2023-12-20 NOTE — Telephone Encounter (Signed)
 Pt called in stating he needs a refill for Losartan . Pt declined to schedule an appt stating he is traveling and only has 5 days worth of medications left. Please follow up with patient.

## 2024-01-02 ENCOUNTER — Encounter (INDEPENDENT_AMBULATORY_CARE_PROVIDER_SITE_OTHER): Payer: Self-pay

## 2024-01-02 NOTE — Telephone Encounter (Signed)
 Please review

## 2024-01-10 DIAGNOSIS — G4733 Obstructive sleep apnea (adult) (pediatric): Secondary | ICD-10-CM | POA: Diagnosis not present

## 2024-01-11 DIAGNOSIS — I10 Essential (primary) hypertension: Secondary | ICD-10-CM | POA: Diagnosis not present

## 2024-01-17 ENCOUNTER — Other Ambulatory Visit (INDEPENDENT_AMBULATORY_CARE_PROVIDER_SITE_OTHER): Payer: Self-pay | Admitting: Family Medicine

## 2024-01-17 DIAGNOSIS — I10 Essential (primary) hypertension: Secondary | ICD-10-CM

## 2024-02-01 NOTE — Telephone Encounter (Signed)
 Medication was refused by provider. Encounter closed

## 2024-02-20 ENCOUNTER — Ambulatory Visit (INDEPENDENT_AMBULATORY_CARE_PROVIDER_SITE_OTHER): Admitting: Podiatry

## 2024-02-20 ENCOUNTER — Encounter: Payer: Self-pay | Admitting: Podiatry

## 2024-02-20 VITALS — Ht 76.0 in | Wt 233.0 lb

## 2024-02-20 DIAGNOSIS — L600 Ingrowing nail: Secondary | ICD-10-CM | POA: Diagnosis not present

## 2024-02-20 NOTE — Patient Instructions (Signed)

## 2024-02-20 NOTE — Progress Notes (Signed)
 Subjective:   Patient ID: Tony [name], male   DOB: 63 y.o.   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  States have been hurting him for years he has tried to trim them soak them and he does get pedicures and they are not able to get them.  They are increasingly more aggravating for him and he would like a solution   Review of Systems  All other systems reviewed and are negative.       Objective:  Physical Exam Vitals and nursing note reviewed.  Constitutional:      Appearance: He is well-developed.  Pulmonary:     Effort: Pulmonary effort is normal.  Musculoskeletal:        General: Normal range of motion.  Skin:    General: Skin is warm.  Neurological:     Mental Status: He is alert.     Neurovascular status intact muscle strength found to be adequate range of motion adequate with incurvated medial lateral borders of the big toenails of both feet painful when pressed with inability to wear shoe gear without discomfort.  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  I recommended removal of both the medial lateral borders patient wants surgery and at this point I allowed him to read and signed consent form.  I infiltrated each big toe 60 mg like Marcaine  mixture sterile prep done using sterile instrumentation removed the medial lateral borders bilateral exposed matrix applied phenol 3 applications 30 seconds followed by alcohol  lavage sterile dressing gave instructions on soaks wear dressings 24 hours take them off earlier if throbbing were to occur and encouraged to call with any questions during the healing process

## 2024-02-29 DIAGNOSIS — L812 Freckles: Secondary | ICD-10-CM | POA: Diagnosis not present

## 2024-02-29 DIAGNOSIS — L821 Other seborrheic keratosis: Secondary | ICD-10-CM | POA: Diagnosis not present

## 2024-02-29 DIAGNOSIS — L570 Actinic keratosis: Secondary | ICD-10-CM | POA: Diagnosis not present

## 2024-03-22 DIAGNOSIS — I10 Essential (primary) hypertension: Secondary | ICD-10-CM | POA: Diagnosis not present
